# Patient Record
Sex: Female | Born: 1960 | Race: White | Hispanic: No | Smoking: Current every day smoker
Health system: Southern US, Community
[De-identification: ages and names within clinical notes are randomized; demographics above are authoritative.]

## PROBLEM LIST (undated history)

## (undated) DIAGNOSIS — I10 Essential (primary) hypertension: Secondary | ICD-10-CM

## (undated) DIAGNOSIS — I639 Cerebral infarction, unspecified: Secondary | ICD-10-CM

## (undated) HISTORY — PX: WRIST SURGERY: SHX841

## (undated) HISTORY — PX: CHOLECYSTECTOMY: SHX55

## (undated) HISTORY — PX: TUBAL LIGATION: SHX77

---

## 2011-12-27 ENCOUNTER — Encounter (HOSPITAL_COMMUNITY): Payer: Self-pay | Admitting: *Deleted

## 2011-12-27 ENCOUNTER — Emergency Department (HOSPITAL_COMMUNITY)
Admission: EM | Admit: 2011-12-27 | Discharge: 2011-12-27 | Disposition: A | Discharge status: Home / Self Care | Payer: Worker's Compensation | Attending: Emergency Medicine | Admitting: Emergency Medicine

## 2011-12-27 DIAGNOSIS — I10 Essential (primary) hypertension: Secondary | ICD-10-CM | POA: Insufficient documentation

## 2011-12-27 DIAGNOSIS — Z23 Encounter for immunization: Secondary | ICD-10-CM | POA: Insufficient documentation

## 2011-12-27 DIAGNOSIS — S61512A Laceration without foreign body of left wrist, initial encounter: Secondary | ICD-10-CM

## 2011-12-27 DIAGNOSIS — F17200 Nicotine dependence, unspecified, uncomplicated: Secondary | ICD-10-CM | POA: Insufficient documentation

## 2011-12-27 DIAGNOSIS — I2510 Atherosclerotic heart disease of native coronary artery without angina pectoris: Secondary | ICD-10-CM | POA: Insufficient documentation

## 2011-12-27 DIAGNOSIS — S61509A Unspecified open wound of unspecified wrist, initial encounter: Secondary | ICD-10-CM | POA: Insufficient documentation

## 2011-12-27 DIAGNOSIS — W260XXA Contact with knife, initial encounter: Secondary | ICD-10-CM | POA: Insufficient documentation

## 2011-12-27 DIAGNOSIS — Y990 Civilian activity done for income or pay: Secondary | ICD-10-CM | POA: Insufficient documentation

## 2011-12-27 HISTORY — DX: Essential (primary) hypertension: I10

## 2011-12-27 MED ORDER — TETANUS-DIPHTH-ACELL PERTUSSIS 5-2.5-18.5 LF-MCG/0.5 IM SUSP
0.5000 mL | Freq: Once | INTRAMUSCULAR | Status: AC
  Administered 2011-12-27: 0.5 mL via INTRAMUSCULAR
  Filled 2011-12-27: qty 0.5

## 2011-12-27 NOTE — ED Notes (Signed)
Pt was at work, was opening box with utility knife, knife cut inside left wrist,called EMS, brought to ED, pt with pressure bandage on left wrist and arm, last tetanus shop 2005, pt states little discomfort in arm

## 2011-12-27 NOTE — ED Notes (Signed)
Pt cut left anterior wrist with knife while opening boxes at work. Wrist wrapped by EMS pta. Bleeding controlled at this time. CMS intact distally to laceration.

## 2011-12-27 NOTE — ED Provider Notes (Signed)
History  This chart was scribed for Michelle Anger, DO by Michelle Montes. The patient was seen in room TR08C/TR08C. Patient's care was started at 1535     CSN: [id number]  Arrival date & time 12/27/11  1304   First MD Initiated Contact with Patient 12/27/11 1535      Chief Complaint  Patient presents with  . Extremity Laceration    The history is provided by the patient. No language interpreter was used.    Michelle Montes is a 51 y.o. female who presents to the Emergency Department complaining of sudden onset and persistence of constant laceration to her left volar forearm that began approx noon today PTA.  Patient says that she was using a box cutter at work when she accidentally cut herself. EMS applied wrapping to the wound and transported the patient to the ED. Patient is not reporting any other symptoms at this time. Patient says Tdap is not UTD. Denies any other injuries, no focal motor weakness, no tingling/numbness in extremity.   Past Medical History  Diagnosis Date  . Hypertension   . Coronary artery disease     History reviewed. No pertinent past surgical history.   History  Substance Use Topics  . Smoking status: Current Every Day Smoker  . Smokeless tobacco: Not on file  . Alcohol Use: No     Review of Systems ROS: Statement: All systems negative except as marked or noted in the HPI; Constitutional: Negative for fever and chills. ; ; Eyes: Negative for eye pain, redness and discharge. ; ; ENMT: Negative for ear pain, hoarseness, nasal congestion, sinus pressure and sore throat. ; ; Cardiovascular: Negative for chest pain, palpitations, diaphoresis, dyspnea and peripheral edema. ; ; Respiratory: Negative for cough, wheezing and stridor. ; ; Gastrointestinal: Negative for nausea, vomiting, diarrhea, abdominal pain, blood in stool, hematemesis, jaundice and rectal bleeding. . ; ; Genitourinary: Negative for dysuria, flank pain and hematuria. ; ; Musculoskeletal:  Negative for back pain and neck pain. Negative for swelling and trauma.; ; Skin: +laceration. Negative for pruritus, rash, abrasions, blisters, bruising and skin lesion.; ; Neuro: Negative for headache, lightheadedness and neck stiffness. Negative for weakness, altered level of consciousness , altered mental status, extremity weakness, paresthesias, involuntary movement, seizure and syncope.       Allergies  Review of patient's allergies indicates no known allergies.  Home Medications  No current outpatient prescriptions on file.  BP 142/83  Pulse 76  Temp 98.2 F (36.8 C)  Resp 20  SpO2 98%  Physical Exam 1545: Physical examination:  Nursing notes reviewed; Vital signs and O2 SAT reviewed;  Constitutional: Well developed, Well nourished, Well hydrated, In no acute distress; Head:  Normocephalic, atraumatic; Eyes: EOMI, PERRL, No scleral icterus; ENMT: Mouth and pharynx normal, Mucous membranes moist; Neck: Supple, Full range of motion, No lymphadenopathy; Cardiovascular: Regular rate and rhythm, No murmur, rub, or gallop; Respiratory: Breath sounds clear & equal bilaterally, No rales, rhonchi, wheezes.  Speaking full sentences with ease, Normal respiratory effort/excursion; Chest: Nontender, Movement normal;; Extremities: Pulses normal, No tenderness, No edema, No deformity. NMS intact left hand.; Neuro: AA&Ox3, Major CN grossly intact.  Speech clear. No gross focal motor or sensory deficits in extremities.; Skin: Color normal, Warm, Dry; +approx 1-2cm hemostatic linear lac to left lateral volar forearm.    ED Course  Procedures    Montes, Michelle B laceration of the left forearm LACERATION REPAIR Performed by: Michelle Montes B  Performed by Michelle Hector, PA-S, under my  supervision Authorized by: Michelle Montes B Consent: Verbal consent obtained. Risks and benefits: risks, benefits and alternatives were discussed Consent given by: patient Patient identity confirmed: provided demographic  data Prepped and Draped in normal sterile fashion Wound explored  Laceration Location: left forearm  Laceration Length: 1.5cm  No Foreign Bodies seen or palpated  Anesthesia: local infiltration  Local anesthetic: lidocaine 2% no epinephrine  Anesthetic total: 2 ml  Irrigation method: syringe Amount of cleaning: standard  Skin closure: 4-0 prolene  Number of sutures: 3  Technique: simple interrupted  Patient tolerance: Patient tolerated the procedure well with no immediate complications.     MDM  MDM Reviewed: nursing note and vitals     1700:  Laceration closed by midlevel.  DSD applied.  Td updated.  Dx d/w pt and family.  Questions answered.  Verb understanding, agreeable to d/c home with outpt f/u.       Laceration repair only was performed by the non-physician practitioner.  I was immediately available for consultation/collaboration.    I personally performed the services described in this documentation (with the exception of laceration repair), which was scribed in my presence. The recorded information has been reviewed and considered. Michelle Montes Michelle Quarry, DO 12/29/11 1341

## 2013-12-18 DIAGNOSIS — G5622 Lesion of ulnar nerve, left upper limb: Secondary | ICD-10-CM

## 2013-12-18 DIAGNOSIS — R0683 Snoring: Secondary | ICD-10-CM | POA: Insufficient documentation

## 2013-12-18 HISTORY — DX: Lesion of ulnar nerve, left upper limb: G56.22

## 2015-04-13 DIAGNOSIS — I639 Cerebral infarction, unspecified: Secondary | ICD-10-CM

## 2015-04-13 HISTORY — DX: Cerebral infarction, unspecified: I63.9

## 2017-09-19 DIAGNOSIS — I160 Hypertensive urgency: Secondary | ICD-10-CM

## 2017-09-19 DIAGNOSIS — R270 Ataxia, unspecified: Secondary | ICD-10-CM

## 2017-09-19 DIAGNOSIS — I639 Cerebral infarction, unspecified: Secondary | ICD-10-CM

## 2017-09-19 DIAGNOSIS — I6789 Other cerebrovascular disease: Secondary | ICD-10-CM

## 2017-09-20 DIAGNOSIS — R270 Ataxia, unspecified: Secondary | ICD-10-CM | POA: Diagnosis not present

## 2017-09-20 DIAGNOSIS — I639 Cerebral infarction, unspecified: Secondary | ICD-10-CM | POA: Diagnosis not present

## 2017-09-20 DIAGNOSIS — I160 Hypertensive urgency: Secondary | ICD-10-CM | POA: Diagnosis not present

## 2017-09-21 DIAGNOSIS — R270 Ataxia, unspecified: Secondary | ICD-10-CM | POA: Diagnosis not present

## 2017-09-21 DIAGNOSIS — I639 Cerebral infarction, unspecified: Secondary | ICD-10-CM | POA: Diagnosis not present

## 2017-09-21 DIAGNOSIS — I160 Hypertensive urgency: Secondary | ICD-10-CM | POA: Diagnosis not present

## 2017-10-12 ENCOUNTER — Other Ambulatory Visit: Payer: Self-pay | Admitting: Neurosurgery

## 2017-10-12 DIAGNOSIS — I671 Cerebral aneurysm, nonruptured: Secondary | ICD-10-CM

## 2017-10-14 ENCOUNTER — Ambulatory Visit (HOSPITAL_COMMUNITY)
Admission: RE | Admit: 2017-10-14 | Discharge: 2017-10-14 | Disposition: A | Discharge status: Home / Self Care | Payer: BC Managed Care – PPO | Source: Ambulatory Visit | Attending: Neurosurgery | Admitting: Neurosurgery

## 2017-10-14 ENCOUNTER — Encounter (HOSPITAL_COMMUNITY): Payer: Self-pay | Admitting: *Deleted

## 2017-10-14 ENCOUNTER — Other Ambulatory Visit: Payer: Self-pay | Admitting: Neurosurgery

## 2017-10-14 DIAGNOSIS — I671 Cerebral aneurysm, nonruptured: Secondary | ICD-10-CM | POA: Diagnosis not present

## 2017-10-14 DIAGNOSIS — I10 Essential (primary) hypertension: Secondary | ICD-10-CM | POA: Diagnosis not present

## 2017-10-14 DIAGNOSIS — F17200 Nicotine dependence, unspecified, uncomplicated: Secondary | ICD-10-CM | POA: Insufficient documentation

## 2017-10-14 DIAGNOSIS — I2510 Atherosclerotic heart disease of native coronary artery without angina pectoris: Secondary | ICD-10-CM | POA: Insufficient documentation

## 2017-10-14 HISTORY — PX: IR ANGIO INTRA EXTRACRAN SEL INTERNAL CAROTID BILAT MOD SED: IMG5363

## 2017-10-14 HISTORY — PX: IR ANGIO VERTEBRAL SEL VERTEBRAL BILAT MOD SED: IMG5369

## 2017-10-14 LAB — CBC WITH DIFFERENTIAL/PLATELET
ABS IMMATURE GRANULOCYTES: 0 10*3/uL (ref 0.0–0.1)
BASOS ABS: 0 10*3/uL (ref 0.0–0.1)
Basophils Relative: 1 %
EOS ABS: 0.2 10*3/uL (ref 0.0–0.7)
Eosinophils Relative: 2 %
HCT: 45.7 % (ref 36.0–46.0)
Hemoglobin: 14.4 g/dL (ref 12.0–15.0)
Immature Granulocytes: 0 %
Lymphocytes Relative: 28 %
Lymphs Abs: 2.3 10*3/uL (ref 0.7–4.0)
MCH: 27.5 pg (ref 26.0–34.0)
MCHC: 31.5 g/dL (ref 30.0–36.0)
MCV: 87.4 fL (ref 78.0–100.0)
MONO ABS: 0.5 10*3/uL (ref 0.1–1.0)
MONOS PCT: 6 %
NEUTROS ABS: 5.2 10*3/uL (ref 1.7–7.7)
NEUTROS PCT: 63 %
Platelets: 184 10*3/uL (ref 150–400)
RBC: 5.23 MIL/uL — ABNORMAL HIGH (ref 3.87–5.11)
RDW: 13.7 % (ref 11.5–15.5)
WBC: 8.2 10*3/uL (ref 4.0–10.5)

## 2017-10-14 LAB — BASIC METABOLIC PANEL
ANION GAP: 8 (ref 5–15)
BUN: 8 mg/dL (ref 6–20)
CO2: 29 mmol/L (ref 22–32)
CREATININE: 0.54 mg/dL (ref 0.44–1.00)
Calcium: 9.6 mg/dL (ref 8.9–10.3)
Chloride: 105 mmol/L (ref 98–111)
Glucose, Bld: 89 mg/dL (ref 70–99)
Potassium: 4.4 mmol/L (ref 3.5–5.1)
Sodium: 142 mmol/L (ref 135–145)

## 2017-10-14 LAB — PROTIME-INR
INR: 0.96
PROTHROMBIN TIME: 12.7 s (ref 11.4–15.2)

## 2017-10-14 LAB — APTT: APTT: 30 s (ref 24–36)

## 2017-10-14 MED ORDER — LIDOCAINE HCL 1 % IJ SOLN
INTRAMUSCULAR | Status: AC
  Filled 2017-10-14: qty 20

## 2017-10-14 MED ORDER — MIDAZOLAM HCL 2 MG/2ML IJ SOLN
INTRAMUSCULAR | Status: AC | PRN
  Administered 2017-10-14: 1 mg via INTRAVENOUS

## 2017-10-14 MED ORDER — CHLORHEXIDINE GLUCONATE CLOTH 2 % EX PADS: 6.0000 | MEDICATED_PAD | Freq: Once | CUTANEOUS | Status: DC

## 2017-10-14 MED ORDER — IOPAMIDOL (ISOVUE-300) INJECTION 61%
INTRAVENOUS | Status: AC
  Administered 2017-10-14: 45 mL
  Filled 2017-10-14: qty 100

## 2017-10-14 MED ORDER — IOPAMIDOL (ISOVUE-300) INJECTION 61%
INTRAVENOUS | Status: AC
  Filled 2017-10-14: qty 100

## 2017-10-14 MED ORDER — CEFAZOLIN SODIUM-DEXTROSE 2-4 GM/100ML-% IV SOLN: 2.0000 g | INTRAVENOUS | Status: DC

## 2017-10-14 MED ORDER — SODIUM CHLORIDE 0.9 % IV SOLN
INTRAVENOUS | Status: DC
  Administered 2017-10-14: 14:00:00 via INTRAVENOUS

## 2017-10-14 MED ORDER — FENTANYL CITRATE (PF) 100 MCG/2ML IJ SOLN
INTRAMUSCULAR | Status: AC
  Filled 2017-10-14: qty 2

## 2017-10-14 MED ORDER — LIDOCAINE HCL (PF) 1 % IJ SOLN
INTRAMUSCULAR | Status: AC | PRN
  Administered 2017-10-14: 10 mL

## 2017-10-14 MED ORDER — MIDAZOLAM HCL 2 MG/2ML IJ SOLN
INTRAMUSCULAR | Status: AC
  Filled 2017-10-14: qty 2

## 2017-10-14 MED ORDER — HYDROCODONE-ACETAMINOPHEN 5-325 MG PO TABS: 1.0000 | ORAL_TABLET | ORAL | Status: DC | PRN

## 2017-10-14 MED ORDER — HEPARIN SODIUM (PORCINE) 1000 UNIT/ML IJ SOLN
INTRAMUSCULAR | Status: AC
  Filled 2017-10-14: qty 2

## 2017-10-14 MED ORDER — FENTANYL CITRATE (PF) 100 MCG/2ML IJ SOLN
INTRAMUSCULAR | Status: AC | PRN
  Administered 2017-10-14: 25 ug via INTRAVENOUS

## 2017-10-14 NOTE — Sedation Documentation (Signed)
Sheath removed, 5FR exoseal closure device used. Manual pressure being held at right groin site.

## 2017-10-14 NOTE — H&P (Signed)
Chief Complaint     Aneurysm  HPI   HPI: Michelle Montes is a 57 y.o. female  who was recently admitted after suffering a left hemispheric stroke approximately 1 month ago.  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  She has been doing well since being discharged home.  She denies any issues with gait instability, weakness in her extremities, headaches, neck pain or incoordination.   She does have a history of prior stroke back in 2014.  No history of MI.  She has a history of hypertension but has been treating with diet changes since 2017  But due to her recent stroke, she was restarted on antihypertensive in the hospital.  She also takes 81 mg aspirin.   She presents today for diagnostic cerebral angiogram for further characterization of aneurysm.   She is without any concerns today.  There are no active problems to display for this patient.   PMH: Past Medical History:  Diagnosis Date  . Coronary artery disease   . Hypertension     PSH: No past surgical history on file.   (Not in a hospital admission)  SH: Social History   Tobacco Use  . Smoking status: Current Every Day Smoker  Substance Use Topics  . Alcohol use: No  . Drug use: Not on file    MEDS: Prior to Admission medications   Not on File    ALLERGY: No Known Allergies  Social History   Tobacco Use  . Smoking status: Current Every Day Smoker  Substance Use Topics  . Alcohol use: No     No family history on file.   ROS   ROS  Exam   There were no vitals filed for this visit. General appearance: WDWN, NAD Eyes: PERRL, Fundoscopic: normal Cardiovascular: Regular rate and rhythm without murmurs, rubs, gallops. No edema or variciosities. Distal pulses normal. Pulmonary: Clear to auscultation Musculoskeletal:     Muscle tone upper extremities: Normal    Muscle tone lower extremities: Normal    Motor exam: Upper  Extremities Deltoid Bicep Tricep Grip  Right 5/5 5/5 5/5 5/5  Left 5/5 5/5 5/5 5/5   Lower Extremity IP Quad PF DF EHL  Right 5/5 5/5 5/5 5/5 5/5  Left 5/5 5/5 5/5 5/5 5/5   Neurological Awake, alert, oriented Memory and concentration grossly intact Speech fluent, appropriate CNII: Visual fields normal CNIII/IV/VI: EOMI CNV: Facial sensation normal CNVII: Symmetric, normal strength CNVIII: Grossly normal CNIX: Normal palate movement CNXI: Trap and SCM strength normal CN XII: Tongue protrusion normal Sensation grossly intact to LT DTR: Normal Coordination (finger/nose & heel/shin): Normal  Results - Imaging/Labs   No results found for this or any previous visit (from the past 48 hour(s)).  No results found.   Impression/Plan   57 y.o. female about 1 month out from multiple small left hemispheric likely embolic strokes.  Workup demonstrated the presence of an approximately 12 mm left cavernous aneurysm. There is concern that the source of emboli might be from the aneurysm itself.   Based on size and age, it was recommended that she undergo   Diagnostic cerebral angiogram.   While in the office, The general treatment options were also discussed, with the need for definitive diagnosis by catheter angiogram. The risks of the angiogram procedure were reviewed, including a 0.1% risk of stroke, and overal risk of approximately 1% including but not limited to groin hematoma, headache,  contrast reaction, and nephropathy.  The patient and her husband understood our discussion and are willing to proceed as above.  All their questions were answered. Consent has been signed.

## 2017-10-14 NOTE — Discharge Instructions (Addendum)
Cerebral Angiogram, Care After °Refer to this sheet in the next few weeks. These instructions provide you with information on caring for yourself after your procedure. Your health care provider may also give you more specific instructions. Your treatment has been planned according to current medical practices, but problems sometimes occur. Call your health care provider if you have any problems or questions after your procedure. °What can I expect after the procedure? °After your procedure, it is typical to have the following: °· Bruising at the catheter insertion site that usually fades within 1-2 weeks. °· Blood collecting in the tissue (hematoma) that may be painful to the touch. It should usually decrease in size and tenderness within 1-2 weeks. °· A mild headache. ° °Follow these instructions at home: °· Take medicines only as directed by your health care provider. °· You may shower 24-48 hours after the procedure or as directed by your health care provider. Remove the bandage (dressing) and gently wash the site with plain soap and water. Pat the area dry with a clean towel. Do not rub the site, because this may cause bleeding. °· Do not take baths, swim, or use a hot tub until your health care provider approves. °· Check your insertion site every day for redness, swelling, or drainage. °· Do not apply powder or lotion to the site. °· Do not lift over 10 lb (4.5 kg) for 5 days after your procedure or as directed by your health care provider. °· Ask your health care provider when it is okay to: °? Return to work or school. °? Resume usual physical activities or sports. °? Resume sexual activity. °· Do not drive home if you are discharged the same day as the procedure. Have someone else drive you. °· You may drive 24 hours after the procedure unless otherwise instructed by your health care provider. °· Do not operate machinery or power tools for 24 hours after the procedure or as directed by your health care  provider. °· If your procedure was done as an outpatient procedure, which means that you went home the same day as your procedure, a responsible adult should be with you for the first 24 hours after you arrive home. °· Keep all follow-up visits as directed by your health care provider. This is important. °Contact a health care provider if: °· You have a fever. °· You have chills. °· You have increased bleeding from the catheter insertion site. Hold pressure on the site. °Get help right away if: °· You have vision changes or loss of vision. °· You have numbness or weakness on one side of your body. °· You have difficulty talking, or you have slurred speech or cannot speak (aphasia). °· You feel confused or have difficulty remembering. °· You have unusual pain at the catheter insertion site. °· You have redness, warmth, or swelling at the catheter insertion site. °· You have drainage (other than a small amount of blood on the dressing) from the catheter insertion site. °· The catheter insertion site is bleeding, and the bleeding does not stop after 30 minutes of holding steady pressure on the site. °These symptoms may represent a serious problem that is an emergency. Do not wait to see if the symptoms will go away. Get medical help right away. Call your local emergency services (911 in [place] Do not drive yourself to the hospital. °This information is not intended to replace advice given to you by your health care provider. Make sure you discuss any questions   you have with your health care provider. °Document Released: 08/13/2013 Document Revised: 09/04/2015 Document Reviewed: 04/11/2013 °Elsevier Interactive Patient Education © 2017 [employer] Inc[employer] °Moderate Conscious Sedation, Adult, Care After °These instructions provide you with information about caring for yourself after your procedure. Your health care provider may also give you more specific instructions. Your treatment has been planned according to current  medical practices, but problems sometimes occur. Call your health care provider if you have any problems or questions after your procedure. °What can I expect after the procedure? °After your procedure, it is common: °· To feel sleepy for several hours. °· To feel clumsy and have poor balance for several hours. °· To have poor judgment for several hours. °· To vomit if you eat too soon. ° °Follow these instructions at home: °For at least 24 hours after the procedure: ° °· Do not: °? Participate in activities where you could fall or become injured. °? Drive. °? Use heavy machinery. °? Drink alcohol. °? Take sleeping pills or medicines that cause drowsiness. °? Make important decisions or sign legal documents. °? Take care of children on your own. °· Rest. °Eating and drinking °· Follow the diet recommended by your health care provider. °· If you vomit: °? Drink water, juice, or soup when you can drink without vomiting. °? Make sure you have little or no nausea before eating solid foods. °General instructions °· Have a responsible adult stay with you until you are awake and alert. °· Take over-the-counter and prescription medicines only as told by your health care provider. °· If you smoke, do not smoke without supervision. °· Keep all follow-up visits as told by your health care provider. This is important. °Contact a health care provider if: °· You keep feeling nauseous or you keep vomiting. °· You feel light-headed. °· You develop a rash. °· You have a fever. °Get help right away if: °· You have trouble breathing. °This information is not intended to replace advice given to you by your health care provider. Make sure you discuss any questions you have with your health care provider. °Document Released: 01/17/2013 Document Revised: 09/01/2015 Document Reviewed: 07/19/2015 °Elsevier Interactive Patient Education © 2018 Elsevier Inc. ° °

## 2017-10-27 ENCOUNTER — Other Ambulatory Visit: Payer: Self-pay | Admitting: Neurosurgery

## 2017-10-27 ENCOUNTER — Other Ambulatory Visit (HOSPITAL_COMMUNITY): Payer: Self-pay | Admitting: Neurosurgery

## 2017-10-27 DIAGNOSIS — I671 Cerebral aneurysm, nonruptured: Secondary | ICD-10-CM

## 2017-11-10 ENCOUNTER — Other Ambulatory Visit: Payer: Self-pay | Admitting: Neurosurgery

## 2017-11-14 ENCOUNTER — Encounter (HOSPITAL_COMMUNITY): Payer: Self-pay

## 2017-11-14 ENCOUNTER — Encounter (HOSPITAL_COMMUNITY)
Admission: RE | Admit: 2017-11-14 | Discharge: 2017-11-14 | Disposition: A | Discharge status: Home / Self Care | Payer: BC Managed Care – PPO | Source: Ambulatory Visit | Attending: Neurosurgery | Admitting: Neurosurgery

## 2017-11-14 ENCOUNTER — Other Ambulatory Visit: Payer: Self-pay

## 2017-11-14 DIAGNOSIS — Z01812 Encounter for preprocedural laboratory examination: Secondary | ICD-10-CM | POA: Diagnosis present

## 2017-11-14 HISTORY — DX: Cerebral infarction, unspecified: I63.9

## 2017-11-14 LAB — CBC WITH DIFFERENTIAL/PLATELET
ABS IMMATURE GRANULOCYTES: 0 10*3/uL (ref 0.0–0.1)
BASOS ABS: 0 10*3/uL (ref 0.0–0.1)
Basophils Relative: 1 %
Eosinophils Absolute: 0.2 10*3/uL (ref 0.0–0.7)
Eosinophils Relative: 3 %
HCT: 45.6 % (ref 36.0–46.0)
HEMOGLOBIN: 14.7 g/dL (ref 12.0–15.0)
Immature Granulocytes: 0 %
LYMPHS PCT: 27 %
Lymphs Abs: 2.1 10*3/uL (ref 0.7–4.0)
MCH: 28 pg (ref 26.0–34.0)
MCHC: 32.2 g/dL (ref 30.0–36.0)
MCV: 86.9 fL (ref 78.0–100.0)
MONO ABS: 0.5 10*3/uL (ref 0.1–1.0)
Monocytes Relative: 6 %
NEUTROS ABS: 4.8 10*3/uL (ref 1.7–7.7)
Neutrophils Relative %: 63 %
Platelets: 206 10*3/uL (ref 150–400)
RBC: 5.25 MIL/uL — AB (ref 3.87–5.11)
RDW: 13.8 % (ref 11.5–15.5)
WBC: 7.7 10*3/uL (ref 4.0–10.5)

## 2017-11-14 LAB — URINALYSIS, ROUTINE W REFLEX MICROSCOPIC
BILIRUBIN URINE: NEGATIVE
Glucose, UA: NEGATIVE mg/dL
HGB URINE DIPSTICK: NEGATIVE
Ketones, ur: NEGATIVE mg/dL
LEUKOCYTES UA: NEGATIVE
NITRITE: NEGATIVE
PROTEIN: NEGATIVE mg/dL
Specific Gravity, Urine: 1.002 — ABNORMAL LOW (ref 1.005–1.030)
pH: 7 (ref 5.0–8.0)

## 2017-11-14 LAB — BASIC METABOLIC PANEL
ANION GAP: 9 (ref 5–15)
BUN: 5 mg/dL — ABNORMAL LOW (ref 6–20)
CO2: 29 mmol/L (ref 22–32)
Calcium: 9.6 mg/dL (ref 8.9–10.3)
Chloride: 105 mmol/L (ref 98–111)
Creatinine, Ser: 0.58 mg/dL (ref 0.44–1.00)
GFR calc Af Amer: >60 mL/min (ref >60)
GLUCOSE: 95 mg/dL (ref 70–99)
POTASSIUM: 3.8 mmol/L (ref 3.5–5.1)
Sodium: 143 mmol/L (ref 135–145)

## 2017-11-14 LAB — PROTIME-INR
INR: 0.94
PROTHROMBIN TIME: 12.5 s (ref 11.4–15.2)

## 2017-11-14 LAB — APTT: APTT: 29 s (ref 24–36)

## 2017-11-14 LAB — SURGICAL PCR SCREEN
MRSA, PCR: NEGATIVE
Staphylococcus aureus: NEGATIVE

## 2017-11-14 NOTE — Pre-Procedure Instructions (Signed)
Michelle Montes  11/14/2017      [facility] [address], [place] - [address] [place] [address] Phone: [phone] Fax: [phone]  [facility] [phone] [facility], [place] - [address] AT [facility] [address] [phone] [place] [address] Phone: [phone] Fax: [phone]    Your procedure is scheduled on Tuesday, August 5.  Report to [facility] Admitting at 9:15 AM                  Your surgery or procedure is scheduled for 11:15 AM A.M.   Call this number if you have problems the morning of surgery: history of: [phone]  This is the number for the Pre- Surgical Desk.      For any other questions, please call [phone], Monday - Friday 8 AM - 4 PM.   Remember:  Do not eat or drink after midnight Monday, August 12.   Take these medicines the morning of surgery with A SIP OF WATER:  Follow your surgeon's instructions regarding Aspirin, Plavix.  Take if needed:  1 Week prior to surgery STOP takingAspirin Products [employer], Excedrin Migraine), Ibuprofen (Advil), Naproxen (Aleve), Vitamins and Herbal Products (ie Fish Oil) omeprazole (PRILOSEC OTC)     Do not wear jewelry, make-up or nail polish.  Do not wear lotions, powders, or perfumes, or deodorant.  Do not shave 48 hours prior to surgery.  Men may shave face and neck.  Do not bring valuables to the hospital.  [facility] is not responsible for any belongings or valuables.  Contacts, dentures or bridgework may not be worn into surgery.  Leave your suitcase in the car.  After surgery it may be brought to your room.  For patients admitted to the hospital, discharge time will be determined by your treatment team.  Patients discharged the day of surgery will not be allowed to drive home.    Special instructions:   [place]- Preparing For Surgery  Before surgery, you can play an important role. Because skin is not sterile, your skin needs to be as free of  germs as possible. You can reduce the number of germs on your skin by washing with CHG (chlorahexidine gluconate) Soap before surgery.  CHG is an antiseptic cleaner which kills germs and bonds with the skin to continue killing germs even after washing.    Oral Hygiene is also important to reduce your risk of infection.  Remember - BRUSH YOUR TEETH THE MORNING OF SURGERY WITH YOUR REGULAR TOOTHPASTE  Please do not use if you have an allergy to CHG or antibacterial soaps. If your skin becomes reddened/irritated stop using the CHG.  Do not shave (including legs and underarms) for at least 48 hours prior to first CHG shower. It is OK to shave your face.  Please follow these instructions carefully.   1. Shower the NIGHT BEFORE SURGERY and the MORNING OF SURGERY with CHG.   2. If you chose to wash your hair, wash your hair first as usual with your normal shampoo.  3. After you shampoo, rinse your hair and body thoroughly to remove the shampoo.  4. Use CHG as you would any other liquid soap. You can apply CHG directly to the skin and wash gently with a scrungie or a clean washcloth.   5. Apply the CHG Soap to your body ONLY FROM THE NECK DOWN.  Do not use on open wounds  or open sores. Avoid contact with your eyes, ears, mouth and genitals (private parts). Wash Face and genitals (private parts)  with your normal soap.  6. Wash thoroughly, paying special attention to the area where your surgery will be performed.  7. Thoroughly rinse your body with warm water from the neck down.  8. DO NOT shower/wash with your normal soap after using and rinsing off the CHG Soap.  9. Pat yourself dry with a CLEAN TOWEL.  10. Wear CLEAN PAJAMAS to bed the night before surgery, wear comfortable clothes the morning of surgery  11. Place CLEAN SHEETS on your bed the night of your first shower and DO NOT SLEEP WITH PETS.  Day of Surgery:  Do not apply any deodorants/lotions.  Please wear clean clothes to the  hospital/surgery center.   Remember to brush your teeth WITH YOUR REGULAR TOOTHPASTE.   Please read over the following fact sheets that you were given.

## 2017-11-14 NOTE — Pre-Procedure Instructions (Signed)
Michelle Montes  11/14/2017      [facility] [address] - [place], [place] - [address] [place] [id number] Phone: [phone] Fax: [phone]  [facility] [phone] [facility]- [place], [place] - [address] [facility] [address] [phone] [address] [place] [address] Phone: [phone] Fax: [phone]    Your procedure is scheduled on Tuesday, August 5.  [facility] at 9:45 AM                  Your surgery or procedure is scheduled for 11:15 AM A.M.   Call this number if you have problems the morning of surgery: history of: [phone]  This is the number for the Pre- Surgical Desk.      For any other questions, please call [phone], Monday - Friday 8 AM - 4 PM.   Remember:  Do not eat or drink after midnight Monday, August 12.   Take these medicines the morning of surgery with A SIP OF WATER: omeprazole (prilosec)  Follow your surgeon's instructions regarding Aspirin, Plavix.  Take if needed:  1 Week prior to surgery STOP taking Aspirin Products (Goody Powder, Excedrin Migraine), Ibuprofen (Advil), Naproxen (Aleve), Vitamins and Herbal Products (ie Fish Oil)     Do not wear jewelry, make-up or nail polish.  Do not wear lotions, powders, or perfumes, or deodorant.  Do not shave 48 hours prior to surgery.  Men may shave face and neck.  Do not bring valuables to the hospital.  [facility] is not responsible for any belongings or valuables.  Contacts, dentures or bridgework may not be worn into surgery.  Leave your suitcase in the car.  After surgery it may be brought to your room.  For patients admitted to the hospital, discharge time will be determined by your treatment team.  Patients discharged the day of surgery will not be allowed to drive home.    Special instructions:   [place]- Preparing For Surgery  Before surgery, you can play an important role. Because skin is not sterile, your skin needs to be as free of  germs as possible. You can reduce the number of germs on your skin by washing with CHG (chlorahexidine gluconate) Soap before surgery.  CHG is an antiseptic cleaner which kills germs and bonds with the skin to continue killing germs even after washing.    Oral Hygiene is also important to reduce your risk of infection.  Remember - BRUSH YOUR TEETH THE MORNING OF SURGERY WITH YOUR REGULAR TOOTHPASTE  Please do not use if you have an allergy to CHG or antibacterial soaps. If your skin becomes reddened/irritated stop using the CHG.  Do not shave (including legs and underarms) for at least 48 hours prior to first CHG shower. It is OK to shave your face.  Please follow these instructions carefully.   1. Shower the NIGHT BEFORE SURGERY and the MORNING OF SURGERY with CHG.   2. If you chose to wash your hair, wash your hair first as usual with your normal shampoo.  3. After you shampoo, rinse your hair and body thoroughly to remove the shampoo.  4. Use CHG as you would any other liquid soap. You can apply CHG directly to the skin and wash gently with a scrungie or a clean washcloth.   5. Apply the CHG Soap to your body ONLY FROM THE NECK DOWN.  Do not use on open wounds  or open sores. Avoid contact with your eyes, ears, mouth and genitals (private parts). Wash Face and genitals (private parts)  with your normal soap.  6. Wash thoroughly, paying special attention to the area where your surgery will be performed.  7. Thoroughly rinse your body with warm water from the neck down.  8. DO NOT shower/wash with your normal soap after using and rinsing off the CHG Soap.  9. Pat yourself dry with a CLEAN TOWEL.  10. Wear CLEAN PAJAMAS to bed the night before surgery, wear comfortable clothes the morning of surgery  11. Place CLEAN SHEETS on your bed the night of your first shower and DO NOT SLEEP WITH PETS.  Day of Surgery:  Do not apply any deodorants/lotions.  Please wear clean clothes to the  hospital/surgery center.   Remember to brush your teeth WITH YOUR REGULAR TOOTHPASTE.   Please read over the following fact sheets that you were given.

## 2017-11-14 NOTE — Progress Notes (Signed)
PCP - Dr. Boyd KerbsPenny Cardiologist - denies cardiac history or cardiac workup  EKG - done in June per patient- [facility]  Pt to take Plavix and Aspirin prior to surgery and up until surgery.   Anesthesia review: follow up requested EKG, pt denies any other cardiac testing  Patient denies shortness of breath, fever, cough and chest pain at PAT appointment   Patient verbalized understanding of instructions that were given to them at the PAT appointment. Patient was also instructed that they will need to review over the PAT instructions again at home before surgery.

## 2017-11-22 ENCOUNTER — Ambulatory Visit (HOSPITAL_COMMUNITY)
Admission: RE | Admit: 2017-11-22 | Discharge: 2017-11-22 | Disposition: A | Discharge status: Home / Self Care | Payer: BC Managed Care – PPO | Source: Ambulatory Visit | Attending: Neurosurgery | Admitting: Neurosurgery

## 2017-12-08 ENCOUNTER — Other Ambulatory Visit: Payer: Self-pay

## 2017-12-08 ENCOUNTER — Encounter (HOSPITAL_COMMUNITY): Payer: Self-pay | Admitting: *Deleted

## 2017-12-08 NOTE — Progress Notes (Signed)
Spoke with pt for pre-op call. Pt had a PAT appt on 11/14/17, but surgery was rescheduled to tomorrow. Pt states nothing has changed with her medications, allergies, medical and surgical history. She states she started the Aspirin and Plavix on 12/02/17 as instructed by Dr. Conchita ParisNundkumar. Pt denies any recent chest pain, fever, flu like symptoms.

## 2017-12-09 ENCOUNTER — Encounter (HOSPITAL_COMMUNITY): Payer: Self-pay | Admitting: *Deleted

## 2017-12-09 ENCOUNTER — Ambulatory Visit (HOSPITAL_COMMUNITY)
Admission: RE | Admit: 2017-12-09 | Discharge: 2017-12-09 | Disposition: A | Discharge status: Home / Self Care | Payer: BC Managed Care – PPO | Source: Ambulatory Visit | Attending: Neurosurgery | Admitting: Neurosurgery

## 2017-12-09 ENCOUNTER — Ambulatory Visit (HOSPITAL_COMMUNITY): Payer: BC Managed Care – PPO | Admitting: Physician Assistant

## 2017-12-09 ENCOUNTER — Encounter (HOSPITAL_COMMUNITY)
Admission: RE | Disposition: A | Discharge status: Home / Self Care | Payer: Self-pay | Source: Home / Self Care | Attending: Neurosurgery

## 2017-12-09 ENCOUNTER — Ambulatory Visit (HOSPITAL_COMMUNITY): Payer: BC Managed Care – PPO | Admitting: Anesthesiology

## 2017-12-09 ENCOUNTER — Inpatient Hospital Stay (HOSPITAL_COMMUNITY)
Admission: RE | Admit: 2017-12-09 | Discharge: 2017-12-10 | DRG: 272 | Disposition: A | Discharge status: Home / Self Care | Payer: BC Managed Care – PPO | Attending: Neurosurgery | Admitting: Neurosurgery

## 2017-12-09 DIAGNOSIS — Z8673 Personal history of transient ischemic attack (TIA), and cerebral infarction without residual deficits: Secondary | ICD-10-CM

## 2017-12-09 DIAGNOSIS — Z7902 Long term (current) use of antithrombotics/antiplatelets: Secondary | ICD-10-CM

## 2017-12-09 DIAGNOSIS — Z9851 Tubal ligation status: Secondary | ICD-10-CM

## 2017-12-09 DIAGNOSIS — I10 Essential (primary) hypertension: Secondary | ICD-10-CM | POA: Diagnosis present

## 2017-12-09 DIAGNOSIS — Z7982 Long term (current) use of aspirin: Secondary | ICD-10-CM | POA: Diagnosis not present

## 2017-12-09 DIAGNOSIS — I671 Cerebral aneurysm, nonruptured: Secondary | ICD-10-CM

## 2017-12-09 DIAGNOSIS — I720 Aneurysm of carotid artery: Secondary | ICD-10-CM | POA: Diagnosis present

## 2017-12-09 DIAGNOSIS — Z79899 Other long term (current) drug therapy: Secondary | ICD-10-CM | POA: Diagnosis not present

## 2017-12-09 DIAGNOSIS — F17210 Nicotine dependence, cigarettes, uncomplicated: Secondary | ICD-10-CM | POA: Diagnosis present

## 2017-12-09 DIAGNOSIS — Z9049 Acquired absence of other specified parts of digestive tract: Secondary | ICD-10-CM

## 2017-12-09 HISTORY — PX: IR ANGIO INTRA EXTRACRAN SEL INTERNAL CAROTID UNI L MOD SED: IMG5361

## 2017-12-09 HISTORY — PX: RADIOLOGY WITH ANESTHESIA: SHX6223

## 2017-12-09 HISTORY — PX: IR TRANSCATH/EMBOLIZ: IMG695

## 2017-12-09 HISTORY — PX: IR ANGIOGRAM FOLLOW UP STUDY: IMG697

## 2017-12-09 LAB — BASIC METABOLIC PANEL
Anion gap: 10 (ref 5–15)
BUN: 10 mg/dL (ref 6–20)
CALCIUM: 9.2 mg/dL (ref 8.9–10.3)
CO2: 22 mmol/L (ref 22–32)
CREATININE: 0.44 mg/dL (ref 0.44–1.00)
Chloride: 108 mmol/L (ref 98–111)
GLUCOSE: 89 mg/dL (ref 70–99)
Potassium: 5.2 mmol/L — ABNORMAL HIGH (ref 3.5–5.1)
Sodium: 140 mmol/L (ref 135–145)

## 2017-12-09 LAB — CBC WITH DIFFERENTIAL/PLATELET
Abs Immature Granulocytes: 0 10*3/uL (ref 0.0–0.1)
BASOS PCT: 1 %
Basophils Absolute: 0 10*3/uL (ref 0.0–0.1)
EOS ABS: 0.4 10*3/uL (ref 0.0–0.7)
EOS PCT: 7 %
HEMATOCRIT: 45.4 % (ref 36.0–46.0)
Hemoglobin: 14.2 g/dL (ref 12.0–15.0)
IMMATURE GRANULOCYTES: 0 %
LYMPHS ABS: 2.1 10*3/uL (ref 0.7–4.0)
Lymphocytes Relative: 33 %
MCH: 28 pg (ref 26.0–34.0)
MCHC: 31.3 g/dL (ref 30.0–36.0)
MCV: 89.5 fL (ref 78.0–100.0)
Monocytes Absolute: 0.4 10*3/uL (ref 0.1–1.0)
Monocytes Relative: 7 %
NEUTROS PCT: 52 %
Neutro Abs: 3.4 10*3/uL (ref 1.7–7.7)
PLATELETS: 198 10*3/uL (ref 150–400)
RBC: 5.07 MIL/uL (ref 3.87–5.11)
RDW: 14.1 % (ref 11.5–15.5)
WBC: 6.3 10*3/uL (ref 4.0–10.5)

## 2017-12-09 LAB — APTT: aPTT: 27 seconds (ref 24–36)

## 2017-12-09 LAB — PROTIME-INR
INR: 0.95
Prothrombin Time: 12.6 seconds (ref 11.4–15.2)

## 2017-12-09 SURGERY — IR WITH ANESTHESIA
Anesthesia: General

## 2017-12-09 MED ORDER — LISINOPRIL 20 MG PO TABS
20.0000 mg | ORAL_TABLET | Freq: Every day | ORAL | Status: DC
  Administered 2017-12-09 – 2017-12-10 (×2): 20 mg via ORAL
  Filled 2017-12-09 (×2): qty 1

## 2017-12-09 MED ORDER — HYDROMORPHONE HCL 1 MG/ML IJ SOLN: 0.2500 mg | INTRAMUSCULAR | Status: DC | PRN

## 2017-12-09 MED ORDER — SODIUM CHLORIDE 0.9 % IV SOLN
INTRAVENOUS | Status: DC | PRN
  Administered 2017-12-09: 20 ug/min via INTRAVENOUS

## 2017-12-09 MED ORDER — LACTATED RINGERS IV SOLN
INTRAVENOUS | Status: DC | PRN
  Administered 2017-12-09: 14:00:00 via INTRAVENOUS

## 2017-12-09 MED ORDER — ONDANSETRON HCL 4 MG/2ML IJ SOLN: 4.0000 mg | INTRAMUSCULAR | Status: DC | PRN

## 2017-12-09 MED ORDER — HYDROCODONE-ACETAMINOPHEN 5-325 MG PO TABS: 1.0000 | ORAL_TABLET | ORAL | Status: DC | PRN

## 2017-12-09 MED ORDER — HYDRALAZINE HCL 20 MG/ML IJ SOLN
INTRAMUSCULAR | Status: AC
  Filled 2017-12-09: qty 1

## 2017-12-09 MED ORDER — ONDANSETRON HCL 4 MG/2ML IJ SOLN
INTRAMUSCULAR | Status: DC | PRN
  Administered 2017-12-09: 4 mg via INTRAVENOUS

## 2017-12-09 MED ORDER — CEFAZOLIN SODIUM-DEXTROSE 2-4 GM/100ML-% IV SOLN
2.0000 g | INTRAVENOUS | Status: AC
  Administered 2017-12-09: 2 g via INTRAVENOUS
  Filled 2017-12-09: qty 100

## 2017-12-09 MED ORDER — LABETALOL HCL 5 MG/ML IV SOLN
10.0000 mg | INTRAVENOUS | Status: DC | PRN
  Administered 2017-12-09 (×4): 10 mg via INTRAVENOUS
  Administered 2017-12-09: 20 mg via INTRAVENOUS
  Filled 2017-12-09: qty 8

## 2017-12-09 MED ORDER — LABETALOL HCL 5 MG/ML IV SOLN
INTRAVENOUS | Status: AC
  Filled 2017-12-09: qty 4

## 2017-12-09 MED ORDER — ATORVASTATIN CALCIUM 80 MG PO TABS
80.0000 mg | ORAL_TABLET | Freq: Every day | ORAL | Status: DC
  Administered 2017-12-09: 80 mg via ORAL
  Filled 2017-12-09: qty 1

## 2017-12-09 MED ORDER — MORPHINE SULFATE (PF) 2 MG/ML IV SOLN: 1.0000 mg | INTRAVENOUS | Status: DC | PRN

## 2017-12-09 MED ORDER — PROMETHAZINE HCL 25 MG/ML IJ SOLN: 6.2500 mg | INTRAMUSCULAR | Status: DC | PRN

## 2017-12-09 MED ORDER — DEXAMETHASONE SODIUM PHOSPHATE 10 MG/ML IJ SOLN
INTRAMUSCULAR | Status: DC | PRN
  Administered 2017-12-09: 10 mg via INTRAVENOUS

## 2017-12-09 MED ORDER — SUGAMMADEX SODIUM 200 MG/2ML IV SOLN
INTRAVENOUS | Status: DC | PRN
  Administered 2017-12-09: 108.8 mg via INTRAVENOUS

## 2017-12-09 MED ORDER — FENTANYL CITRATE (PF) 250 MCG/5ML IJ SOLN
INTRAMUSCULAR | Status: DC | PRN
  Administered 2017-12-09 (×2): 50 ug via INTRAVENOUS

## 2017-12-09 MED ORDER — LIDOCAINE 2% (20 MG/ML) 5 ML SYRINGE
INTRAMUSCULAR | Status: DC | PRN
  Administered 2017-12-09: 60 mg via INTRAVENOUS

## 2017-12-09 MED ORDER — CLOPIDOGREL BISULFATE 75 MG PO TABS
75.0000 mg | ORAL_TABLET | Freq: Every day | ORAL | Status: DC
  Administered 2017-12-10: 75 mg via ORAL
  Filled 2017-12-09: qty 1

## 2017-12-09 MED ORDER — LACTATED RINGERS IV SOLN
Freq: Once | INTRAVENOUS | Status: AC
  Administered 2017-12-09: 11:00:00 via INTRAVENOUS

## 2017-12-09 MED ORDER — PHENYLEPHRINE 40 MCG/ML (10ML) SYRINGE FOR IV PUSH (FOR BLOOD PRESSURE SUPPORT)
PREFILLED_SYRINGE | INTRAVENOUS | Status: DC | PRN
  Administered 2017-12-09 (×2): 40 ug via INTRAVENOUS

## 2017-12-09 MED ORDER — ONDANSETRON HCL 4 MG PO TABS: 4.0000 mg | ORAL_TABLET | ORAL | Status: DC | PRN

## 2017-12-09 MED ORDER — IOHEXOL 300 MG/ML  SOLN
150.0000 mL | Freq: Once | INTRAMUSCULAR | Status: AC | PRN
  Administered 2017-12-09: 40 mL via INTRA_ARTERIAL

## 2017-12-09 MED ORDER — OXYCODONE HCL 5 MG PO TABS: 5.0000 mg | ORAL_TABLET | Freq: Once | ORAL | Status: DC | PRN

## 2017-12-09 MED ORDER — GLYCOPYRROLATE PF 0.2 MG/ML IJ SOSY
PREFILLED_SYRINGE | INTRAMUSCULAR | Status: DC | PRN
  Administered 2017-12-09: .2 mg via INTRAVENOUS

## 2017-12-09 MED ORDER — SODIUM CHLORIDE 0.9 % IV SOLN
INTRAVENOUS | Status: DC
  Administered 2017-12-09: 19:00:00 via INTRAVENOUS

## 2017-12-09 MED ORDER — CHLORHEXIDINE GLUCONATE CLOTH 2 % EX PADS: 6.0000 | MEDICATED_PAD | Freq: Once | CUTANEOUS | Status: DC

## 2017-12-09 MED ORDER — OMEPRAZOLE MAGNESIUM 20 MG PO TBEC: 20.0000 mg | DELAYED_RELEASE_TABLET | Freq: Every day | ORAL | Status: DC | PRN

## 2017-12-09 MED ORDER — ROCURONIUM BROMIDE 10 MG/ML (PF) SYRINGE
PREFILLED_SYRINGE | INTRAVENOUS | Status: DC | PRN
  Administered 2017-12-09: 50 mg via INTRAVENOUS

## 2017-12-09 MED ORDER — PANTOPRAZOLE SODIUM 20 MG PO TBEC
20.0000 mg | DELAYED_RELEASE_TABLET | Freq: Every day | ORAL | Status: DC
  Administered 2017-12-09 – 2017-12-10 (×2): 20 mg via ORAL
  Filled 2017-12-09 (×2): qty 1

## 2017-12-09 MED ORDER — ASPIRIN EC 325 MG PO TBEC
325.0000 mg | DELAYED_RELEASE_TABLET | Freq: Every day | ORAL | Status: DC
  Administered 2017-12-10: 325 mg via ORAL
  Filled 2017-12-09: qty 1

## 2017-12-09 MED ORDER — PROPOFOL 10 MG/ML IV BOLUS
INTRAVENOUS | Status: DC | PRN
  Administered 2017-12-09: 150 mg via INTRAVENOUS
  Administered 2017-12-09: 50 mg via INTRAVENOUS

## 2017-12-09 MED ORDER — HEPARIN SODIUM (PORCINE) 1000 UNIT/ML IJ SOLN
INTRAMUSCULAR | Status: DC | PRN
  Administered 2017-12-09: 5000 [IU] via INTRAVENOUS

## 2017-12-09 MED ORDER — OXYCODONE HCL 5 MG/5ML PO SOLN: 5.0000 mg | Freq: Once | ORAL | Status: DC | PRN

## 2017-12-09 MED ORDER — MEPERIDINE HCL 50 MG/ML IJ SOLN: 6.2500 mg | INTRAMUSCULAR | Status: DC | PRN

## 2017-12-09 MED ORDER — HYDRALAZINE HCL 20 MG/ML IJ SOLN
10.0000 mg | Freq: Once | INTRAMUSCULAR | Status: AC
  Administered 2017-12-09: 10 mg via INTRAVENOUS

## 2017-12-09 NOTE — Anesthesia Procedure Notes (Signed)
Arterial Line Insertion Start/End8/30/2019 12:40 PM, 12/09/2017 12:48 PM Performed by: Tillman AbideHawkins, Yulianna Folse B, CRNA, CRNA  Patient location: Pre-op. Preanesthetic checklist: patient identified, IV checked, site marked, risks and benefits discussed, surgical consent, monitors and equipment checked, pre-op evaluation, timeout performed and anesthesia consent Lidocaine 1% used for infiltration radial was placed Catheter size: 20 G Hand hygiene performed , maximum sterile barriers used  and Seldinger technique used Allen's test indicative of satisfactory collateral circulation Attempts: 1 Procedure performed without using ultrasound guided technique. Following insertion, dressing applied and Biopatch. Post procedure assessment: normal  Patient tolerated the procedure well with no immediate complications.

## 2017-12-09 NOTE — Sedation Documentation (Signed)
Sheath removed, exoseal closure device. Manual pressure being held at right groin site.

## 2017-12-09 NOTE — H&P (Signed)
Chief Complaint   Aneurysm   HPI   HPI: Michelle Montes is a 57 y.o. female with suffered a left hemispheric embolic stroke roughly 2 months ago. CTA angiogram was ordered as part of the stroke work up and was significant for a 12mm left cavernous carotid aneurysm. She was admitted for roughly 2 days at [facility] for monitoring after the CVA but was discharged without complication. She did not suffer any residual effects from CVA. She underwent diagnostic angiogram for further characterization which confirmed the aneurysm. She presents today for treatment. She is without any concerns.   There are no active problems to display for this patient.  PMH: Past Medical History:  Diagnosis Date  . Hypertension   . Stroke [facility])    had weakness on left side- no residual now    PSH: Past Surgical History:  Procedure Laterality Date  . CHOLECYSTECTOMY     2004  . IR ANGIO INTRA EXTRACRAN SEL INTERNAL CAROTID BILAT MOD SED  10/14/2017  . IR ANGIO VERTEBRAL SEL VERTEBRAL BILAT MOD SED  10/14/2017  . TUBAL LIGATION     2005  . WRIST SURGERY Left    2017- screws and plates    Medications Prior to Admission  Medication Sig Dispense Refill Last Dose  . aspirin 325 MG EC tablet Take 325 mg by mouth daily.  2   . atorvastatin (LIPITOR) 80 MG tablet Take 80 mg by mouth at bedtime.  1 10/13/2017 at Unknown time  . lisinopril (PRINIVIL,ZESTRIL) 20 MG tablet Take 20 mg by mouth daily.   1 10/14/2017 at 0630  . omeprazole (PRILOSEC OTC) 20 MG tablet Take 20 mg by mouth daily as needed (heartburn/indigestion).    Past Week at Unknown time  . clopidogrel (PLAVIX) 75 MG tablet Take 75 mg by mouth daily.  2     SH: Social History   Tobacco Use  . Smoking status: Current Every Day Smoker    Packs/day: 1.50    Years: 42.00    Pack years: 63.00    Types: Cigarettes  . Smokeless tobacco: Never Used  Substance Use Topics  . Alcohol use: No  . Drug use: Never    MEDS: Prior to Admission  medications   Medication Sig Start Date End Date Taking? Authorizing Provider  aspirin 325 MG EC tablet Take 325 mg by mouth daily. 10/27/17  Yes [provider]  atorvastatin (LIPITOR) 80 MG tablet Take 80 mg by mouth at bedtime. 09/21/17  Yes [provider]  lisinopril (PRINIVIL,ZESTRIL) 20 MG tablet Take 20 mg by mouth daily.  09/21/17  Yes [provider]  omeprazole (PRILOSEC OTC) 20 MG tablet Take 20 mg by mouth daily as needed (heartburn/indigestion).    Yes [provider]  clopidogrel (PLAVIX) 75 MG tablet Take 75 mg by mouth daily. 10/27/17   [provider]    ALLERGY: No Known Allergies  Social History   Tobacco Use  . Smoking status: Current Every Day Smoker    Packs/day: 1.50    Years: 42.00    Pack years: 63.00    Types: Cigarettes  . Smokeless tobacco: Never Used  Substance Use Topics  . Alcohol use: No     History reviewed. No pertinent family history.   ROS   ROS  Exam   There were no vitals filed for this visit. General appearance: WDWN, NAD Eyes: PERRL, Fundoscopic: normal Cardiovascular: Regular rate and rhythm without murmurs, rubs, gallops. No edema or variciosities. Distal  pulses normal. Pulmonary: Clear to auscultation Musculoskeletal:     Muscle tone upper extremities: Normal    Muscle tone lower extremities: Normal    Motor exam: Upper Extremities Deltoid Bicep Tricep Grip  Right 5/5 5/5 5/5 5/5  Left 5/5 5/5 5/5 5/5   Lower Extremity IP Quad PF DF EHL  Right 5/5 5/5 5/5 5/5 5/5  Left 5/5 5/5 5/5 5/5 5/5   Neurological Awake, alert, oriented Memory and concentration grossly intact Speech fluent, appropriate CNII: Visual fields normal CNIII/IV/VI: EOMI CNV: Facial sensation normal CNVII: Symmetric, normal strength CNVIII: Grossly normal CNIX: Normal palate movement CNXI: Trap and SCM strength normal CN XII: Tongue protrusion normal Sensation grossly intact to LT DTR: Normal Coordination  (finger/nose & heel/shin): Normal  Results - Imaging/Labs   No results found for this or any previous visit (from the past 48 hour(s)).  No results found.  Diagnostic cerebral angiogram date 10/14/17 was reviewed. 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   Impression/Plan   57 y.o. female with multiple small embolic left hemispheric strokes without carotid disease and negative cardiac work up after hospitalization. She has a 12mm left cavernous aneurysm which could the source of the emboli. Based on size and history, it was rec that she undergo treatment with a flow diverting stent.  While in the office risks, benefits and alternatives to the procedure were discussed. Patient stated in own language understanding of the procedure and wished to proceed. Consent signed.

## 2017-12-09 NOTE — Progress Notes (Signed)
  NEUROSURGERY PROGRESS NOTE   Pt seen in PACU postop. No c/o.  EXAM:  BP (!) 165/88   Pulse 66   Temp 98.7 F (37.1 C) (Oral)   Resp 18   Ht 5\' 6"  (1.676 m)   Wt 54.4 kg   SpO2 100%   BMI 19.37 kg/m   Awake, alert, oriented  Speech fluent, appropriate  CN grossly intact  5/5 BUE/BLE   IMPRESSION:  57 y.o. female s/p Surpass embolization LICA aneurysm, doing well  PLAN: - Will admit for observation overnight - Cont ASA/Plavix for 6 months

## 2017-12-09 NOTE — Transfer of Care (Signed)
Immediate Anesthesia Transfer of Care Note  Patient: Michelle Montes  Procedure(s) Performed: Embolization of aneurysm (N/A )  Patient Location: PACU  Anesthesia Type:General  Level of Consciousness: awake, alert , oriented and patient cooperative  Airway & Oxygen Therapy: Patient Spontanous Breathing and Patient connected to nasal cannula oxygen  Post-op Assessment: Report given to RN, Post -op Vital signs reviewed and stable and Patient moving all extremities X 4  Post vital signs: Reviewed and stable  Last Vitals:  Vitals Value Taken Time  BP 141/78 12/09/2017  4:02 PM  Temp    Pulse 77 12/09/2017  4:03 PM  Resp 13 12/09/2017  4:03 PM  SpO2 100 % 12/09/2017  4:03 PM  Vitals shown include unvalidated device data.  Last Pain:  Vitals:   12/09/17 1039  TempSrc: Oral         Complications: No apparent anesthesia complications

## 2017-12-09 NOTE — Anesthesia Preprocedure Evaluation (Signed)
Anesthesia Evaluation  Patient identified by MRN, date of birth, ID band Patient awake    Reviewed: Allergy & Precautions, NPO status , Patient's Chart, lab work & pertinent test results  Airway Mallampati: II  TM Distance: >3 FB Neck ROM: Full    Dental no notable dental hx.    Pulmonary neg pulmonary ROS, Current Smoker,    Pulmonary exam normal breath sounds clear to auscultation       Cardiovascular hypertension, Pt. on medications negative cardio ROS Normal cardiovascular exam Rhythm:Regular Rate:Normal     Neuro/Psych CVA, No Residual Symptoms negative neurological ROS  negative psych ROS   GI/Hepatic negative GI ROS, Neg liver ROS,   Endo/Other  negative endocrine ROS  Renal/GU negative Renal ROS  negative genitourinary   Musculoskeletal negative musculoskeletal ROS (+)   Abdominal   Peds negative pediatric ROS (+)  Hematology negative hematology ROS (+)   Anesthesia Other Findings   Reproductive/Obstetrics negative OB ROS                             Anesthesia Physical Anesthesia Plan  ASA: III  Anesthesia Plan: General   Post-op Pain Management:    Induction: Intravenous  PONV Risk Score and Plan: 2 and Ondansetron and Midazolam  Airway Management Planned: Oral ETT  Additional Equipment: Arterial line  Intra-op Plan:   Post-operative Plan: Extubation in OR  Informed Consent: I have reviewed the patients History and Physical, chart, labs and discussed the procedure including the risks, benefits and alternatives for the proposed anesthesia with the patient or authorized representative who has indicated his/her understanding and acceptance.   Dental advisory given  Plan Discussed with: CRNA  Anesthesia Plan Comments:         Anesthesia Quick Evaluation

## 2017-12-09 NOTE — Anesthesia Procedure Notes (Signed)
Procedure Name: Intubation Date/Time: 12/09/2017 2:01 PM Performed by: Adria Dillonkin, Aslan Montagna A, CRNA Pre-anesthesia Checklist: Patient identified, Emergency Drugs available, Suction available and Patient being monitored Patient Re-evaluated:Patient Re-evaluated prior to induction Oxygen Delivery Method: Circle system utilized Preoxygenation: Pre-oxygenation with 100% oxygen Induction Type: IV induction Ventilation: Mask ventilation without difficulty Laryngoscope Size: Miller and 2 Grade View: Grade I Tube type: Oral Tube size: 7.0 mm Number of attempts: 1 Airway Equipment and Method: Stylet Placement Confirmation: ETT inserted through vocal cords under direct vision,  positive ETCO2,  CO2 detector and breath sounds checked- equal and bilateral Secured at: 22 cm Tube secured with: Tape Dental Injury: Teeth and Oropharynx as per pre-operative assessment

## 2017-12-09 NOTE — Sedation Documentation (Signed)
Pressure dressing and tegaderm/gauze dressing applied to right groin site. Right groin soft. Distal Pulses present.

## 2017-12-09 NOTE — Sedation Documentation (Signed)
Right groin site checked with PACU, RN (clean, dry, intact, soft). Right DP, PT pulses +1/doppler signal present. Left DP (doppler signal present), PT pulses +1/doppler signal present.

## 2017-12-10 NOTE — Progress Notes (Signed)
Foley catheter removed. Pt ambulated to bathroom with standby assistance and voided urine without difficulty. Vital signs stable throughout. No complaints of pain or discomfort at groin.

## 2017-12-10 NOTE — Progress Notes (Signed)
Subjective: Patient reports doing well  Objective: Vital signs in last 24 hours: Temp:  [97.6 F (36.4 C)-98.2 F (36.8 C)] 97.6 F (36.4 C) (08/31 0800) Pulse Rate:  [56-97] 62 (08/31 1000) Resp:  [12-24] 14 (08/31 1000) BP: (86-164)/(58-89) 130/69 (08/31 1000) SpO2:  [96 %-100 %] 100 % (08/31 1000) Arterial Line BP: (103-180)/(40-89) 156/61 (08/31 1000)  Intake/Output from previous day: 08/30 0701 - 08/31 0700 In: 2904.1 [P.O.:200; I.V.:2704.1] Out: 2226 [Urine:2076; Blood:150] Intake/Output this shift: Total I/O In: 555.8 [P.O.:340; I.V.:215.8] Out: 275 [Urine:275]  Physical Exam: Normal exam  Lab Results: Recent Labs    12/09/17 1113  WBC 6.3  HGB 14.2  HCT 45.4  PLT 198   BMET Recent Labs    12/09/17 1113  NA 140  K 5.2*  CL 108  CO2 22  GLUCOSE 89  BUN 10  CREATININE 0.44  CALCIUM 9.2    Studies/Results: No results found.  Assessment/Plan: Discharge home    LOS: 1 day    Garcia Dalzell D, MD 12/10/2017, 10:56 AM

## 2017-12-10 NOTE — Discharge Summary (Signed)
Physician Discharge Summary  Patient ID: Michelle Montes MRN: [id number] DOB/AGE: 58/10/1960 59 y.o.  Admit date: 12/09/2017 Discharge date: 12/10/2017  Admission Diagnoses: Left hemispheric stroke and left Cavernous carotid aneurysm  Discharge Diagnoses: Left hemispheric stroke and left Cavernous carotid aneurysm Active Problems:   Cerebral aneurysm, nonruptured   Discharged Condition: good  Hospital Course: Patient underwent Surpass embolization LICA aneurysm and was admitted to Neuro-ICU for postop care.  She did well with this procedure and was discharged home on the morning of POD 1.   Consults: None  Significant Diagnostic Studies: angiography: Surpass embolization LICA aneurysm  Treatments: surgery: Surpass embolization LICA aneurysm  Discharge Exam: Blood pressure 130/69, pulse 62, temperature 97.6 F (36.4 C), temperature source Oral, resp. rate 14, height 5\' 6"  (1.676 m), weight 54.4 kg, SpO2 100 %. Neurologic: Alert and oriented X 3, normal strength and tone. Normal symmetric reflexes. Normal coordination and gait Wound:CDI  Disposition: Home  Discharge Instructions    Diet - low sodium heart healthy   Complete by:  As directed    Increase activity slowly   Complete by:  As directed      Allergies as of 12/10/2017   No Known Allergies     Medication List    TAKE these medications   aspirin 325 MG EC tablet Take 325 mg by mouth daily.   atorvastatin 80 MG tablet Commonly known as:  LIPITOR Take 80 mg by mouth at bedtime.   clopidogrel 75 MG tablet Commonly known as:  PLAVIX Take 75 mg by mouth daily.   lisinopril 20 MG tablet Commonly known as:  PRINIVIL,ZESTRIL Take 20 mg by mouth daily.   omeprazole 20 MG tablet Commonly known as:  PRILOSEC OTC Take 20 mg by mouth daily as needed (heartburn/indigestion).        Signed: Dorian HeckleSTERN,Lorilynn Lehr D, MD 12/10/2017, 11:01 AM

## 2017-12-10 NOTE — Anesthesia Postprocedure Evaluation (Signed)
Anesthesia Post Note  Patient: Michelle Montes  Procedure(s) Performed: Embolization of aneurysm (N/A )     Patient location during evaluation: PACU Anesthesia Type: General Level of consciousness: awake and alert Pain management: pain level controlled Vital Signs Assessment: post-procedure vital signs reviewed and stable Respiratory status: spontaneous breathing, nonlabored ventilation, respiratory function stable and patient connected to nasal cannula oxygen Cardiovascular status: blood pressure returned to baseline and stable Postop Assessment: no apparent nausea or vomiting Anesthetic complications: no    Last Vitals:  Vitals:   12/10/17 0800 12/10/17 0900  BP: 120/70 111/69  Pulse: 86 71  Resp: 20 15  Temp:    SpO2: 100% 99%    Last Pain:  Vitals:   12/10/17 0939  TempSrc:   PainSc: 0-No pain                 Kennieth RadFitzgerald, Shikha Bibb E

## 2017-12-13 ENCOUNTER — Encounter (HOSPITAL_COMMUNITY): Payer: Self-pay | Admitting: Neurosurgery

## 2017-12-13 ENCOUNTER — Other Ambulatory Visit (HOSPITAL_COMMUNITY): Payer: Self-pay | Admitting: Neurosurgery

## 2017-12-13 DIAGNOSIS — I671 Cerebral aneurysm, nonruptured: Secondary | ICD-10-CM

## 2017-12-20 ENCOUNTER — Encounter (HOSPITAL_COMMUNITY): Payer: Self-pay | Admitting: Neurosurgery

## 2018-05-12 ENCOUNTER — Other Ambulatory Visit: Payer: Self-pay | Admitting: Neurosurgery

## 2018-05-12 ENCOUNTER — Other Ambulatory Visit (HOSPITAL_COMMUNITY): Payer: Self-pay | Admitting: Neurosurgery

## 2018-05-12 DIAGNOSIS — I671 Cerebral aneurysm, nonruptured: Secondary | ICD-10-CM

## 2018-06-01 NOTE — H&P (Signed)
Chief Complaint     Aneurysm  HPI   HPI: Michelle Montes is a 58 y.o. female status post elective treatment internal carotid artery aneurysm via surpass embolization approximately 6 months ago,  Who presents for routine diagnostic angiogram for routine surveillance.  She is without any concerns.  Patient Active Problem List   Diagnosis Date Noted  . 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 Hypertension   . Stroke [facility])    had weakness on left side- no residual now    PSH: Past Surgical History:  Procedure Laterality Date  . CHOLECYSTECTOMY     2004  . IR ANGIO INTRA EXTRACRAN SEL INTERNAL CAROTID BILAT MOD SED  10/14/2017  . IR ANGIO INTRA EXTRACRAN SEL INTERNAL CAROTID UNI L MOD SED  12/09/2017  . IR ANGIO VERTEBRAL SEL VERTEBRAL BILAT MOD SED  10/14/2017  . IR ANGIOGRAM FOLLOW UP STUDY  12/09/2017  . IR TRANSCATH/EMBOLIZ  12/09/2017  . RADIOLOGY WITH ANESTHESIA N/A 12/09/2017   Procedure: Embolization of aneurysm;  Surgeon: Lisbeth Renshaw, MD;  Location: [facility] OR;  Service: Radiology;  Laterality: N/A;  . TUBAL LIGATION     2005  . WRIST SURGERY Left    2017- screws and plates    (Not in a hospital admission)   SH: Social History   Tobacco Use  . Smoking status: Current Every Day Smoker    Packs/day: 1.50    Years: 42.00    Pack years: 63.00    Types: Cigarettes  . Smokeless tobacco: Never Used  Substance Use Topics  . Alcohol use: No  . Drug use: Never    MEDS: Prior to Admission medications   Medication Sig Start Date End Date Taking? Authorizing Provider  aspirin 325 MG EC tablet Take 325 mg by mouth daily. 10/27/17  Yes [provider]  atorvastatin (LIPITOR) 80 MG tablet Take 80 mg by mouth at bedtime. 09/21/17  Yes [provider]  azithromycin (ZITHROMAX) 250 MG tablet Take 250-500 mg by mouth See admin instructions. Take 500 mg on the first day, then take 250 mg daily for 4 days    Yes [provider]  clopidogrel (PLAVIX) 75 MG tablet Take 75 mg by mouth daily. 10/27/17  Yes [provider]  lisinopril (PRINIVIL,ZESTRIL) 20 MG tablet Take 20 mg by mouth daily.  09/21/17  Yes [provider]  omeprazole (PRILOSEC OTC) 20 MG tablet Take 20 mg by mouth daily as needed (heartburn/indigestion).    Yes [provider]    ALLERGY: No Known Allergies  Social History   Tobacco Use  . Smoking status: Current Every Day Smoker    Packs/day: 1.50    Years: 42.00    Pack years: 63.00    Types: Cigarettes  . Smokeless tobacco: Never Used  Substance Use Topics  . Alcohol use: No     No family history on file.   ROS   ROS  Exam   There were no vitals filed for this visit. General appearance: WDWN, NAD Eyes: No scleral injection Cardiovascular: Regular rate and rhythm without murmurs, rubs, gallops. No edema or variciosities. Distal pulses normal. Pulmonary: Effort normal, non-labored breathing Musculoskeletal:     Muscle tone upper extremities: Normal    Muscle tone lower extremities: Normal    Motor exam: Upper Extremities Deltoid Bicep Tricep Grip  Right 5/5 5/5 5/5 5/5  Left 5/5 5/5 5/5 5/5   Lower Extremity IP Quad  PF DF EHL  Right 5/5 5/5 5/5 5/5 5/5  Left 5/5 5/5 5/5 5/5 5/5   Neurological Mental Status:    - Patient is awake, alert, oriented to person, place, month, year, and situation    - Patient is able to give a clear and coherent history.    - No signs of aphasia or neglect Cranial Nerves    - II: Visual Fields are full. PERRL    - III/IV/VI: EOMI without ptosis or diploplia.     - V: Facial sensation is grossly normal    - VII: Facial movement is symmetric.     - VIII: hearing is intact to voice    - X: Uvula elevates symmetrically    - XI: Shoulder shrug is symmetric.    - XII: tongue is midline without atrophy or fasciculations.  Sensory: Sensation grossly intact to LT   Results - Imaging/Labs    No results found for this or any previous visit (from the past 48 hour(s)).  No results found.  Impression/Plan   58 y.o. female   Status post serve past embolization of a left internal carotid artery aneurysm approximately 6 months ago.  We will proceed with follow-up angiogram for routine surveillance.    While in the office risks, benefits and alternatives to the procedure were discussed.  Patient stated in own language understanding and wished to proceed.

## 2018-06-02 ENCOUNTER — Encounter (HOSPITAL_COMMUNITY): Payer: Self-pay | Admitting: Neurosurgery

## 2018-06-02 ENCOUNTER — Other Ambulatory Visit (HOSPITAL_COMMUNITY): Payer: Self-pay | Admitting: Neurosurgery

## 2018-06-02 ENCOUNTER — Ambulatory Visit (HOSPITAL_COMMUNITY)
Admission: RE | Admit: 2018-06-02 | Discharge: 2018-06-02 | Disposition: A | Discharge status: Home / Self Care | Payer: BC Managed Care – PPO | Source: Ambulatory Visit | Attending: Neurosurgery | Admitting: Neurosurgery

## 2018-06-02 ENCOUNTER — Other Ambulatory Visit: Payer: Self-pay

## 2018-06-02 DIAGNOSIS — F17210 Nicotine dependence, cigarettes, uncomplicated: Secondary | ICD-10-CM | POA: Diagnosis not present

## 2018-06-02 DIAGNOSIS — I671 Cerebral aneurysm, nonruptured: Secondary | ICD-10-CM

## 2018-06-02 DIAGNOSIS — Z9851 Tubal ligation status: Secondary | ICD-10-CM | POA: Diagnosis not present

## 2018-06-02 DIAGNOSIS — I10 Essential (primary) hypertension: Secondary | ICD-10-CM | POA: Diagnosis not present

## 2018-06-02 DIAGNOSIS — Z7982 Long term (current) use of aspirin: Secondary | ICD-10-CM | POA: Insufficient documentation

## 2018-06-02 DIAGNOSIS — Z79899 Other long term (current) drug therapy: Secondary | ICD-10-CM | POA: Diagnosis not present

## 2018-06-02 DIAGNOSIS — Z7902 Long term (current) use of antithrombotics/antiplatelets: Secondary | ICD-10-CM | POA: Insufficient documentation

## 2018-06-02 DIAGNOSIS — Z8673 Personal history of transient ischemic attack (TIA), and cerebral infarction without residual deficits: Secondary | ICD-10-CM | POA: Diagnosis not present

## 2018-06-02 HISTORY — PX: IR ANGIO INTRA EXTRACRAN SEL INTERNAL CAROTID UNI L MOD SED: IMG5361

## 2018-06-02 LAB — BASIC METABOLIC PANEL
Anion gap: 7 (ref 5–15)
BUN: 11 mg/dL (ref 6–20)
CO2: 28 mmol/L (ref 22–32)
Calcium: 9.1 mg/dL (ref 8.9–10.3)
Chloride: 106 mmol/L (ref 98–111)
Creatinine, Ser: 0.55 mg/dL (ref 0.44–1.00)
GFR calc Af Amer: >60 mL/min (ref >60)
GFR calc non Af Amer: >60 mL/min (ref >60)
Glucose, Bld: 87 mg/dL (ref 70–99)
POTASSIUM: 3.7 mmol/L (ref 3.5–5.1)
Sodium: 141 mmol/L (ref 135–145)

## 2018-06-02 LAB — CBC WITH DIFFERENTIAL/PLATELET
ABS IMMATURE GRANULOCYTES: 0.03 10*3/uL (ref 0.00–0.07)
Basophils Absolute: 0 10*3/uL (ref 0.0–0.1)
Basophils Relative: 0 %
Eosinophils Absolute: 0.3 10*3/uL (ref 0.0–0.5)
Eosinophils Relative: 3 %
HCT: 44.9 % (ref 36.0–46.0)
HEMOGLOBIN: 14.3 g/dL (ref 12.0–15.0)
Immature Granulocytes: 0 %
LYMPHS PCT: 21 %
Lymphs Abs: 1.9 10*3/uL (ref 0.7–4.0)
MCH: 28.1 pg (ref 26.0–34.0)
MCHC: 31.8 g/dL (ref 30.0–36.0)
MCV: 88.2 fL (ref 80.0–100.0)
Monocytes Absolute: 0.6 10*3/uL (ref 0.1–1.0)
Monocytes Relative: 7 %
NEUTROS ABS: 6.4 10*3/uL (ref 1.7–7.7)
Neutrophils Relative %: 69 %
Platelets: 211 10*3/uL (ref 150–400)
RBC: 5.09 MIL/uL (ref 3.87–5.11)
RDW: 13.5 % (ref 11.5–15.5)
WBC: 9.2 10*3/uL (ref 4.0–10.5)
nRBC: 0 % (ref 0.0–0.2)

## 2018-06-02 LAB — PROTIME-INR
INR: 0.95
Prothrombin Time: 12.6 seconds (ref 11.4–15.2)

## 2018-06-02 LAB — APTT: aPTT: 31 seconds (ref 24–36)

## 2018-06-02 MED ORDER — NITROGLYCERIN 1 MG/10 ML FOR IR/CATH LAB
INTRA_ARTERIAL | Status: AC
  Administered 2018-06-02: 400 ug
  Filled 2018-06-02: qty 10

## 2018-06-02 MED ORDER — HEPARIN SODIUM (PORCINE) 1000 UNIT/ML IJ SOLN
INTRAMUSCULAR | Status: AC | PRN
  Administered 2018-06-02: 3000 [IU] via INTRAVENOUS

## 2018-06-02 MED ORDER — CHLORHEXIDINE GLUCONATE CLOTH 2 % EX PADS: 6.0000 | MEDICATED_PAD | Freq: Once | CUTANEOUS | Status: DC

## 2018-06-02 MED ORDER — FENTANYL CITRATE (PF) 100 MCG/2ML IJ SOLN
INTRAMUSCULAR | Status: AC
  Filled 2018-06-02: qty 2

## 2018-06-02 MED ORDER — VERAPAMIL HCL 2.5 MG/ML IV SOLN
INTRAVENOUS | Status: AC
  Administered 2018-06-02: 2.5 mg
  Filled 2018-06-02: qty 2

## 2018-06-02 MED ORDER — SODIUM CHLORIDE 0.9 % IV SOLN
INTRAVENOUS | Status: AC | PRN
  Administered 2018-06-02: 10 mL/h via INTRAVENOUS

## 2018-06-02 MED ORDER — FENTANYL CITRATE (PF) 100 MCG/2ML IJ SOLN
INTRAMUSCULAR | Status: AC | PRN
  Administered 2018-06-02: 25 ug via INTRAVENOUS

## 2018-06-02 MED ORDER — MIDAZOLAM HCL 2 MG/2ML IJ SOLN
INTRAMUSCULAR | Status: AC | PRN
  Administered 2018-06-02: 1 mg via INTRAVENOUS

## 2018-06-02 MED ORDER — MIDAZOLAM HCL 2 MG/2ML IJ SOLN
INTRAMUSCULAR | Status: AC
  Filled 2018-06-02: qty 2

## 2018-06-02 MED ORDER — LIDOCAINE HCL 1 % IJ SOLN
INTRAMUSCULAR | Status: AC | PRN
  Administered 2018-06-02: 4 mL

## 2018-06-02 MED ORDER — IOPAMIDOL (ISOVUE-300) INJECTION 61%
INTRAVENOUS | Status: AC
  Administered 2018-06-02: 60 mL
  Filled 2018-06-02: qty 100

## 2018-06-02 MED ORDER — LIDOCAINE HCL 1 % IJ SOLN
INTRAMUSCULAR | Status: AC
  Filled 2018-06-02: qty 20

## 2018-06-02 MED ORDER — CEFAZOLIN SODIUM-DEXTROSE 2-4 GM/100ML-% IV SOLN: 2.0000 g | INTRAVENOUS | Status: DC

## 2018-06-02 MED ORDER — HEPARIN SODIUM (PORCINE) 1000 UNIT/ML IJ SOLN
INTRAMUSCULAR | Status: AC
  Filled 2018-06-02: qty 1

## 2018-06-02 NOTE — Discharge Instructions (Signed)
Radial Site Care ° °This sheet gives you information about how to care for yourself after your procedure. Your health care provider may also give you more specific instructions. If you have problems or questions, contact your health care provider. °What can I expect after the procedure? °After the procedure, it is common to have: °· Bruising and tenderness at the catheter insertion area. °Follow these instructions at home: °Medicines °· Take over-the-counter and prescription medicines only as told by your health care provider. °Insertion site care °· Follow instructions from your health care provider about how to take care of your insertion site. Make sure you: °? Wash your hands with soap and water before you change your bandage (dressing). If soap and water are not available, use hand sanitizer. °? Change your dressing as told by your health care provider. °? Leave stitches (sutures), skin glue, or adhesive strips in place. These skin closures may need to stay in place for 2 weeks or longer. If adhesive strip edges start to loosen and curl up, you may trim the loose edges. Do not remove adhesive strips completely unless your health care provider tells you to do that. °· Check your insertion site every day for signs of infection. Check for: °? Redness, swelling, or pain. °? Fluid or blood. °? Pus or a bad smell. °? Warmth. °· Do not take baths, swim, or use a hot tub until your health care provider approves. °· You may shower 24-48 hours after the procedure, or as directed by your health care provider. °? Remove the dressing and gently wash the site with plain soap and water. °? Pat the area dry with a clean towel. °? Do not rub the site. That could cause bleeding. °· Do not apply powder or lotion to the site. °Activity ° °· For 24 hours after the procedure, or as directed by your health care provider: °? Do not flex or bend the affected arm. °? Do not push or pull heavy objects with the affected arm. °? Do not  drive yourself home from the hospital or clinic. You may drive 24 hours after the procedure unless your health care provider tells you not to. °? Do not operate machinery or power tools. °· Do not lift anything that is heavier than 10 lb (4.5 kg), or the limit that you are told, until your health care provider says that it is safe. °· Ask your health care provider when it is okay to: °? Return to work or school. °? Resume usual physical activities or sports. °? Resume sexual activity. °General instructions °· If the catheter site starts to bleed, raise your arm and put firm pressure on the site. If the bleeding does not stop, get help right away. This is a medical emergency. °· If you went home on the same day as your procedure, a responsible adult should be with you for the first 24 hours after you arrive home. °· Keep all follow-up visits as told by your health care provider. This is important. °Contact a health care provider if: °· You have a fever. °· You have redness, swelling, or yellow drainage around your insertion site. °Get help right away if: °· You have unusual pain at the radial site. °· The catheter insertion area swells very fast. °· The insertion area is bleeding, and the bleeding does not stop when you hold steady pressure on the area. °· Your arm or hand becomes pale, cool, tingly, or numb. °These symptoms may represent a serious problem   that is an emergency. Do not wait to see if the symptoms will go away. Get medical help right away. Call your local emergency services (911 in the [place] Do not drive yourself to the hospital. °Summary °· After the procedure, it is common to have bruising and tenderness at the site. °· Follow instructions from your health care provider about how to take care of your radial site wound. Check the wound every day for signs of infection. °· Do not lift anything that is heavier than 10 lb (4.5 kg), or the limit that you are told, until your health care provider says  that it is safe. °This information is not intended to replace advice given to you by your health care provider. Make sure you discuss any questions you have with your health care provider. °Document Released: 05/01/2010 Document Revised: 05/04/2017 Document Reviewed: 05/04/2017 °Elsevier Interactive Patient Education © 2019 [employer] ° °

## 2019-02-27 ENCOUNTER — Other Ambulatory Visit: Payer: Self-pay | Admitting: Neurosurgery

## 2019-02-27 DIAGNOSIS — I671 Cerebral aneurysm, nonruptured: Secondary | ICD-10-CM

## 2019-03-02 ENCOUNTER — Other Ambulatory Visit (HOSPITAL_COMMUNITY)
Admission: RE | Admit: 2019-03-02 | Discharge: 2019-03-02 | Disposition: A | Discharge status: Home / Self Care | Payer: BC Managed Care – PPO | Source: Ambulatory Visit | Attending: Neurosurgery | Admitting: Neurosurgery

## 2019-03-02 DIAGNOSIS — Z01812 Encounter for preprocedural laboratory examination: Secondary | ICD-10-CM | POA: Diagnosis present

## 2019-03-02 DIAGNOSIS — Z20828 Contact with and (suspected) exposure to other viral communicable diseases: Secondary | ICD-10-CM | POA: Insufficient documentation

## 2019-03-04 LAB — NOVEL CORONAVIRUS, NAA (HOSP ORDER, SEND-OUT TO REF LAB; TAT 18-24 HRS): SARS-CoV-2, NAA: NOT DETECTED

## 2019-03-05 NOTE — H&P (Addendum)
Chief Complaint   aneurysm  HPI   HPI: Michelle Montes is a 58 y.o. 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  She presents today for routine diagnostic cerebral angiogram for monitoring.  She is without any concerns. She has been off Plavix, remains on ASA.  Patient Active Problem List   Diagnosis Date Noted  . 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 Hypertension   . Stroke [facility])    had weakness on left side- no residual now    PSH: Past Surgical History:  Procedure Laterality Date  . CHOLECYSTECTOMY     2004  . IR ANGIO INTRA EXTRACRAN SEL INTERNAL CAROTID BILAT MOD SED  10/14/2017  . IR ANGIO INTRA EXTRACRAN SEL INTERNAL CAROTID UNI L MOD SED  12/09/2017  . IR ANGIO INTRA EXTRACRAN SEL INTERNAL CAROTID UNI L MOD SED  06/02/2018  . IR ANGIO VERTEBRAL SEL VERTEBRAL BILAT MOD SED  10/14/2017  . IR ANGIOGRAM FOLLOW UP STUDY  12/09/2017  . IR TRANSCATH/EMBOLIZ  12/09/2017  . RADIOLOGY WITH ANESTHESIA N/A 12/09/2017   Procedure: Embolization of aneurysm;  Surgeon: Consuella Lose, MD;  Location: [facility];  Service: Radiology;  Laterality: N/A;  . TUBAL LIGATION     2005  . WRIST SURGERY Left    2017- screws and plates    (Not in a hospital admission)   SH: Social History   Tobacco Use  . Smoking status: Current Every Day Smoker    Packs/day: 1.50    Years: 42.00    Pack years: 63.00    Types: Cigarettes  . Smokeless tobacco: Never Used  Substance Use Topics  . Alcohol use: No  . Drug use: Never    MEDS: Prior to Admission medications   Medication Sig Start Date End Date Taking? Authorizing Provider  aspirin 325 MG EC tablet Take 325 mg by mouth daily. 10/27/17   [provider]  atorvastatin (LIPITOR) 80 MG tablet Take 80 mg by mouth at bedtime. 09/21/17   [provider]  azithromycin (ZITHROMAX) 250 MG tablet Take 250-500 mg by mouth  See admin instructions. Take 500 mg on the first day, then take 250 mg daily for 4 days    [provider]  clopidogrel (PLAVIX) 75 MG tablet Take 75 mg by mouth daily. 10/27/17   [provider]  lisinopril (PRINIVIL,ZESTRIL) 20 MG tablet Take 20 mg by mouth daily.  09/21/17   [provider]  omeprazole (PRILOSEC OTC) 20 MG tablet Take 20 mg by mouth daily as needed (heartburn/indigestion).     [provider]    ALLERGY: No Known Allergies  Social History   Tobacco Use  . Smoking status: Current Every Day Smoker    Packs/day: 1.50    Years: 42.00    Pack years: 63.00    Types: Cigarettes  . Smokeless tobacco: Never Used  Substance Use Topics  . Alcohol use: No     No family history on file.   ROS   ROS  Exam   There were no vitals filed for this visit. General appearance: WDWN, NAD Eyes: No scleral injection Cardiovascular: Regular rate and rhythm without murmurs, rubs, gallops. No edema or variciosities. Distal pulses normal. Pulmonary: Effort normal, non-labored breathing Musculoskeletal:     Muscle tone upper extremities: Normal    Muscle tone lower extremities: Normal    Motor exam: Upper Extremities Deltoid Bicep  Tricep Grip  Right 5/5 5/5 5/5 5/5  Left 5/5 5/5 5/5 5/5   Lower Extremity IP Quad PF DF EHL  Right 5/5 5/5 5/5 5/5 5/5  Left 5/5 5/5 5/5 5/5 5/5   Neurological Mental Status:    - Patient is awake, alert, oriented to person, place, month, year, and situation    - Patient is able to give a clear and coherent history.    - No signs of aphasia or neglect Cranial Nerves    - II: Visual Fields are full. PERRL    - III/IV/VI: EOMI without ptosis or diploplia.     - V: Facial sensation is grossly normal    - VII: Facial movement is symmetric.     - VIII: hearing is intact to voice    - X: Uvula elevates symmetrically    - XI: Shoulder shrug is symmetric.    - XII: tongue is midline without atrophy or  fasciculations.  Sensory: Sensation grossly intact to LT  Results - Imaging/Labs   No results found for this or any previous visit (from the past 48 hour(s)).  No results found.  IMAGING: Diagnostic cerebral angiogram of February 2020 was reviewed and compared to the procedure in August 2019.  Although my initial interpretation indicated continued filling with stable appearance of the cavernous aneurysm, it appears to me that the aneurysm is in fact noticeably smaller on the February angiogram compared to the initial procedure of August.  No InStent stenosis is seen.  Impression/Plan   58 y.o. 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  We will proceed with continued angiographic surveillance.  Risks, benefits and alternatives were discussed.  Patient stated understanding and wished to proceed.  Lisbeth Renshaw, MD [facility]

## 2019-03-06 ENCOUNTER — Encounter (HOSPITAL_COMMUNITY): Payer: Self-pay | Admitting: Neurosurgery

## 2019-03-06 ENCOUNTER — Ambulatory Visit (HOSPITAL_COMMUNITY)
Admission: RE | Admit: 2019-03-06 | Discharge: 2019-03-06 | Disposition: A | Discharge status: Home / Self Care | Payer: BC Managed Care – PPO | Source: Ambulatory Visit | Attending: Neurosurgery | Admitting: Neurosurgery

## 2019-03-06 ENCOUNTER — Other Ambulatory Visit: Payer: Self-pay | Admitting: Neurosurgery

## 2019-03-06 ENCOUNTER — Other Ambulatory Visit: Payer: Self-pay

## 2019-03-06 DIAGNOSIS — I671 Cerebral aneurysm, nonruptured: Secondary | ICD-10-CM

## 2019-03-06 DIAGNOSIS — Z79899 Other long term (current) drug therapy: Secondary | ICD-10-CM | POA: Insufficient documentation

## 2019-03-06 DIAGNOSIS — Z7902 Long term (current) use of antithrombotics/antiplatelets: Secondary | ICD-10-CM | POA: Insufficient documentation

## 2019-03-06 DIAGNOSIS — Z8673 Personal history of transient ischemic attack (TIA), and cerebral infarction without residual deficits: Secondary | ICD-10-CM | POA: Diagnosis not present

## 2019-03-06 DIAGNOSIS — F17210 Nicotine dependence, cigarettes, uncomplicated: Secondary | ICD-10-CM | POA: Diagnosis not present

## 2019-03-06 DIAGNOSIS — I10 Essential (primary) hypertension: Secondary | ICD-10-CM | POA: Insufficient documentation

## 2019-03-06 HISTORY — PX: IR US GUIDE VASC ACCESS RIGHT: IMG2390

## 2019-03-06 HISTORY — PX: IR ANGIO INTRA EXTRACRAN SEL INTERNAL CAROTID BILAT MOD SED: IMG5363

## 2019-03-06 HISTORY — PX: IR ANGIO VERTEBRAL SEL VERTEBRAL UNI R MOD SED: IMG5368

## 2019-03-06 LAB — URINALYSIS, ROUTINE W REFLEX MICROSCOPIC
Bilirubin Urine: NEGATIVE
Glucose, UA: NEGATIVE mg/dL
Hgb urine dipstick: NEGATIVE
Ketones, ur: NEGATIVE mg/dL
Nitrite: NEGATIVE
Protein, ur: NEGATIVE mg/dL
Specific Gravity, Urine: 1.017 (ref 1.005–1.030)
pH: 6 (ref 5.0–8.0)

## 2019-03-06 LAB — BASIC METABOLIC PANEL
Anion gap: 7 (ref 5–15)
BUN: 11 mg/dL (ref 6–20)
CO2: 27 mmol/L (ref 22–32)
Calcium: 9.3 mg/dL (ref 8.9–10.3)
Chloride: 107 mmol/L (ref 98–111)
Creatinine, Ser: 0.69 mg/dL (ref 0.44–1.00)
GFR calc Af Amer: >60 mL/min (ref >60)
GFR calc non Af Amer: >60 mL/min (ref >60)
Glucose, Bld: 91 mg/dL (ref 70–99)
Potassium: 4.6 mmol/L (ref 3.5–5.1)
Sodium: 141 mmol/L (ref 135–145)

## 2019-03-06 LAB — CBC WITH DIFFERENTIAL/PLATELET
Abs Immature Granulocytes: 0.03 10*3/uL (ref 0.00–0.07)
Basophils Absolute: 0 10*3/uL (ref 0.0–0.1)
Basophils Relative: 0 %
Eosinophils Absolute: 0.2 10*3/uL (ref 0.0–0.5)
Eosinophils Relative: 2 %
HCT: 44.3 % (ref 36.0–46.0)
Hemoglobin: 14.2 g/dL (ref 12.0–15.0)
Immature Granulocytes: 0 %
Lymphocytes Relative: 21 %
Lymphs Abs: 1.8 10*3/uL (ref 0.7–4.0)
MCH: 28.7 pg (ref 26.0–34.0)
MCHC: 32.1 g/dL (ref 30.0–36.0)
MCV: 89.5 fL (ref 80.0–100.0)
Monocytes Absolute: 0.5 10*3/uL (ref 0.1–1.0)
Monocytes Relative: 6 %
Neutro Abs: 5.9 10*3/uL (ref 1.7–7.7)
Neutrophils Relative %: 71 %
Platelets: 196 10*3/uL (ref 150–400)
RBC: 4.95 MIL/uL (ref 3.87–5.11)
RDW: 13.4 % (ref 11.5–15.5)
WBC: 8.5 10*3/uL (ref 4.0–10.5)
nRBC: 0 % (ref 0.0–0.2)

## 2019-03-06 LAB — APTT: aPTT: 29 seconds (ref 24–36)

## 2019-03-06 LAB — PROTIME-INR
INR: 0.9 (ref 0.8–1.2)
Prothrombin Time: 12.2 seconds (ref 11.4–15.2)

## 2019-03-06 MED ORDER — HEPARIN SODIUM (PORCINE) 1000 UNIT/ML IJ SOLN
INTRAMUSCULAR | Status: AC
  Filled 2019-03-06: qty 1

## 2019-03-06 MED ORDER — FENTANYL CITRATE (PF) 100 MCG/2ML IJ SOLN
INTRAMUSCULAR | Status: AC
  Filled 2019-03-06: qty 2

## 2019-03-06 MED ORDER — CEFAZOLIN SODIUM-DEXTROSE 2-4 GM/100ML-% IV SOLN: 2.0000 g | INTRAVENOUS | Status: DC

## 2019-03-06 MED ORDER — HEPARIN SODIUM (PORCINE) 1000 UNIT/ML IJ SOLN
INTRAMUSCULAR | Status: AC | PRN
  Administered 2019-03-06: 3000 [IU] via INTRAVENOUS

## 2019-03-06 MED ORDER — VERAPAMIL HCL 2.5 MG/ML IV SOLN
INTRAVENOUS | Status: AC
  Filled 2019-03-06: qty 2

## 2019-03-06 MED ORDER — CHLORHEXIDINE GLUCONATE CLOTH 2 % EX PADS: 6.0000 | MEDICATED_PAD | Freq: Once | CUTANEOUS | Status: DC

## 2019-03-06 MED ORDER — FENTANYL CITRATE (PF) 100 MCG/2ML IJ SOLN
INTRAMUSCULAR | Status: AC | PRN
  Administered 2019-03-06: 25 ug via INTRAVENOUS

## 2019-03-06 MED ORDER — NITROGLYCERIN 1 MG/10 ML FOR IR/CATH LAB
INTRA_ARTERIAL | Status: AC
  Filled 2019-03-06: qty 10

## 2019-03-06 MED ORDER — IOHEXOL 300 MG/ML  SOLN
100.0000 mL | Freq: Once | INTRAMUSCULAR | Status: AC | PRN
  Administered 2019-03-06: 35 mL via INTRA_ARTERIAL

## 2019-03-06 MED ORDER — LIDOCAINE HCL 1 % IJ SOLN
INTRAMUSCULAR | Status: AC
  Filled 2019-03-06: qty 20

## 2019-03-06 MED ORDER — MIDAZOLAM HCL 2 MG/2ML IJ SOLN
INTRAMUSCULAR | Status: AC
  Filled 2019-03-06: qty 2

## 2019-03-06 MED ORDER — SODIUM CHLORIDE 0.9 % IV SOLN: INTRAVENOUS | Status: DC

## 2019-03-06 MED ORDER — HYDROCODONE-ACETAMINOPHEN 5-325 MG PO TABS: 1.0000 | ORAL_TABLET | ORAL | Status: DC | PRN

## 2019-03-06 MED ORDER — NITROGLYCERIN 1 MG/10 ML FOR IR/CATH LAB
INTRA_ARTERIAL | Status: AC | PRN
  Administered 2019-03-06: 400 ug via INTRA_ARTERIAL

## 2019-03-06 MED ORDER — MIDAZOLAM HCL 2 MG/2ML IJ SOLN
INTRAMUSCULAR | Status: AC | PRN
  Administered 2019-03-06: 1 mg via INTRAVENOUS

## 2019-03-06 MED ORDER — VERAPAMIL HCL 2.5 MG/ML IV SOLN
INTRAVENOUS | Status: AC | PRN
  Administered 2019-03-06: 2.5 mg via INTRA_ARTERIAL

## 2019-03-06 NOTE — Discharge Instructions (Addendum)
Radial Site Care ° °This sheet gives you information about how to care for yourself after your procedure. Your health care provider may also give you more specific instructions. If you have problems or questions, contact your health care provider. °What can I expect after the procedure? °After the procedure, it is common to have: °· Bruising and tenderness at the catheter insertion area. °Follow these instructions at home: °Medicines °· Take over-the-counter and prescription medicines only as told by your health care provider. °Insertion site care °· Follow instructions from your health care provider about how to take care of your insertion site. Make sure you: °? Wash your hands with soap and water before you change your bandage (dressing). If soap and water are not available, use hand sanitizer. °? Change your dressing as told by your health care provider. °? Leave stitches (sutures), skin glue, or adhesive strips in place. These skin closures may need to stay in place for 2 weeks or longer. If adhesive strip edges start to loosen and curl up, you may trim the loose edges. Do not remove adhesive strips completely unless your health care provider tells you to do that. °· Check your insertion site every day for signs of infection. Check for: °? Redness, swelling, or pain. °? Fluid or blood. °? Pus or a bad smell. °? Warmth. °· Do not take baths, swim, or use a hot tub until your health care provider approves. °· You may shower 24-48 hours after the procedure, or as directed by your health care provider. °? Remove the dressing and gently wash the site with plain soap and water. °? Pat the area dry with a clean towel. °? Do not rub the site. That could cause bleeding. °· Do not apply powder or lotion to the site. °Activity ° °· For 24 hours after the procedure, or as directed by your health care provider: °? Do not flex or bend the affected arm. °? Do not push or pull heavy objects with the affected arm. °? Do not  drive yourself home from the hospital or clinic. You may drive 24 hours after the procedure unless your health care provider tells you not to. °? Do not operate machinery or power tools. °· Do not lift anything that is heavier than 10 lb (4.5 kg), or the limit that you are told, until your health care provider says that it is safe. °· Ask your health care provider when it is okay to: °? Return to work or school. °? Resume usual physical activities or sports. °? Resume sexual activity. °General instructions °· If the catheter site starts to bleed, raise your arm and put firm pressure on the site. If the bleeding does not stop, get help right away. This is a medical emergency. °· If you went home on the same day as your procedure, a responsible adult should be with you for the first 24 hours after you arrive home. °· Keep all follow-up visits as told by your health care provider. This is important. °Contact a health care provider if: °· You have a fever. °· You have redness, swelling, or yellow drainage around your insertion site. °Get help right away if: °· You have unusual pain at the radial site. °· The catheter insertion area swells very fast. °· The insertion area is bleeding, and the bleeding does not stop when you hold steady pressure on the area. °· Your arm or hand becomes pale, cool, tingly, or numb. °These symptoms may represent a serious problem   that is an emergency. Do not wait to see if the symptoms will go away. Get medical help right away. Call your local emergency services (911 in the [place] Do not drive yourself to the hospital. °Summary °· After the procedure, it is common to have bruising and tenderness at the site. °· Follow instructions from your health care provider about how to take care of your radial site wound. Check the wound every day for signs of infection. °· Do not lift anything that is heavier than 10 lb (4.5 kg), or the limit that you are told, until your health care provider says  that it is safe. °This information is not intended to replace advice given to you by your health care provider. Make sure you discuss any questions you have with your health care provider. °Document Released: 05/01/2010 Document Revised: 05/04/2017 Document Reviewed: 05/04/2017 °Elsevier Patient Education © 2020 [employer] °Cerebral Angiogram, Care After °This sheet gives you information about how to care for yourself after your procedure. Your health care provider may also give you more specific instructions. If you have problems or questions, contact your health care provider. °What can I expect after the procedure? °After your procedure, it is common to have: °· Bruising and tenderness at the catheter insertion site. °· A mild headache. °Follow these instructions at home: °Insertion site care ° °· Follow instructions from your health care provider about how to take care of the insertion site. Make sure you: °? Wash your hands with soap and water before you change your bandage (dressing). If soap and water are not available, use hand sanitizer. °? Change your dressing as told by your health care provider. °? Leave stitches (sutures), skin glue, or adhesive strips in place. These skin closures may need to stay in place for 2 weeks or longer. If adhesive strip edges start to loosen and curl up, you may trim the loose edges. Do not remove adhesive strips completely unless your health care provider tells you to do that. °· Do not apply powder or lotion to the site. °· Do not take baths, swim, or use a hot tub until your health care provider says it is okay to do so. °· You may shower 24-48 hours after the procedure or as told by your health care provider. °? Gently wash the site with plain soap and water. °? Pat the area dry with a clean towel. °? Do not rub the site. This may cause bleeding. °· Check your incision area every day for signs of infection. Check for: °? Redness, swelling, or pain. °? Fluid or  blood. °? Warmth. °? Pus or a bad smell. °Activity °· Rest as told by your health care provider. °· Do not lift anything that is heavier than 10 lb (4.5 kg), or the limit that your health care provider tells you, until he or she says that it is safe. °· Do not drive for 24 hours if you were given a medicine to help you relax (sedative). °· Do not drive or use heavy machinery while taking prescription pain medicine. °General instructions ° °· Return to your normal activities as told by your health care provider. Ask your health care provider what activities are safe for you. °· If the catheter site starts to bleed, lie flat and put pressure on the site. °· Drink enough fluid to keep your urine clear or pale yellow. This helps flush the contrast dye from your body. °· Take over-the-counter and prescription medicines only as told   by your health care provider. °· Keep all follow-up visits as directed by your health care provider. This is important. °Contact a health care provider if: °· You have a fever or chills. °· You have redness, swelling, or more pain around your insertion site. °· You have fluid or blood coming from your insertion site. °· The insertion site feels warm to the touch. °· You have pus or a bad smell coming from your insertion site. °· You have more bruising around the insertion site. °· Blood collects in the tissue around the catheter site (hematoma), and the hematoma may be painful to the touch. °Get help right away if: °· You have vision changes or loss of vision. °· The catheter insertion area swells very fast. °· You have numbness or weakness on one side of your body. °· You have trouble talking, or you have slurred speech or cannot speak (aphasia). °· You feel confused or have trouble remembering. °· You have severe pain at the catheter insertion area. °· The catheter insertion area is bleeding, and the bleeding does not stop after 30 minutes of holding steady pressure on the site. °· The arm  or leg where the catheter was inserted is numb, tingling, or cold. °· You have chest pain. °· You have trouble breathing. °· You have a rash. °· You have trouble using the arm or leg where the catheter was inserted. °These symptoms may represent a serious problem that is an emergency. Do not wait to see if the symptoms will go away. Get medical help right away. Call your local emergency services (911 in [place] Do not drive yourself to the hospital. °Summary °· After your procedure, it is common to have bruising and tenderness at the site where the catheter was inserted. °· If your catheter insertion site begins to bleed, put direct pressure on it until the bleeding stops. °· After your procedure, it is important to rest and drink plenty of fluids. °· Return to your normal activities as told by your health care provider. Ask your health care provider what activities are safe for you. °This information is not intended to replace advice given to you by your health care provider. Make sure you discuss any questions you have with your health care provider. °Document Released: 08/13/2013 Document Revised: 03/11/2017 Document Reviewed: 05/03/2016 °Elsevier Patient Education © 2020 [employer] °Moderate Conscious Sedation, Adult, Care After °These instructions provide you with information about caring for yourself after your procedure. Your health care provider may also give you more specific instructions. Your treatment has been planned according to current medical practices, but problems sometimes occur. Call your health care provider if you have any problems or questions after your procedure. °What can I expect after the procedure? °After your procedure, it is common: °· To feel sleepy for several hours. °· To feel clumsy and have poor balance for several hours. °· To have poor judgment for several hours. °· To vomit if you eat too soon. °Follow these instructions at home: °For at least 24 hours after the  procedure: ° °· Do not: °? Participate in activities where you could fall or become injured. °? Drive. °? Use heavy machinery. °? Drink alcohol. °? Take sleeping pills or medicines that cause drowsiness. °? Make important decisions or sign legal documents. °? Take care of children on your own. °· Rest. °Eating and drinking °· Follow the diet recommended by your health care provider. °· If you vomit: °? Drink water, juice, or soup when   you can drink without vomiting. °? Make sure you have little or no nausea before eating solid foods. °General instructions °· Have a responsible adult stay with you until you are awake and alert. °· Take over-the-counter and prescription medicines only as told by your health care provider. °· If you smoke, do not smoke without supervision. °· Keep all follow-up visits as told by your health care provider. This is important. °Contact a health care provider if: °· You keep feeling nauseous or you keep vomiting. °· You feel light-headed. °· You develop a rash. °· You have a fever. °Get help right away if: °· You have trouble breathing. °This information is not intended to replace advice given to you by your health care provider. Make sure you discuss any questions you have with your health care provider. °Document Released: 01/17/2013 Document Revised: 03/11/2017 Document Reviewed: 07/19/2015 °Elsevier Patient Education © 2020 [employer] ° °

## 2019-03-06 NOTE — Op Note (Signed)
  NEUROSURGERY BRIEF OPERATIVE  NOTE   PREOP DX: LICA aneurysm  POSTOP DX: Same  PROCEDURE: Diagnostic cerebral angiogram  SURGEON: Dr. Consuella Lose, MD  ANESTHESIA: IV Sedation with Local  EBL: Minimal  SPECIMENS: None  COMPLICATIONS: None  CONDITION: Stable to recovery  FINDINGS (Full report in [facility]): 1. Complete occlusion of previously treated cavernous LICA aneurysm ~[id number] after Surpass embolization. No in-stent stenosis is seen. 2. No other aneurysms, AVM, or fistulas seen.

## 2019-03-07 ENCOUNTER — Other Ambulatory Visit: Payer: Self-pay | Admitting: Neurosurgery

## 2019-03-07 ENCOUNTER — Encounter (HOSPITAL_COMMUNITY): Payer: Self-pay

## 2019-03-07 DIAGNOSIS — I671 Cerebral aneurysm, nonruptured: Secondary | ICD-10-CM

## 2019-10-16 ENCOUNTER — Other Ambulatory Visit: Payer: Self-pay | Admitting: Legal Medicine

## 2020-01-14 ENCOUNTER — Other Ambulatory Visit: Payer: Self-pay | Admitting: Legal Medicine

## 2020-07-14 ENCOUNTER — Other Ambulatory Visit: Payer: Self-pay | Admitting: Legal Medicine

## 2020-10-10 ENCOUNTER — Other Ambulatory Visit: Payer: Self-pay | Admitting: Legal Medicine

## 2021-01-15 ENCOUNTER — Telehealth: Payer: Self-pay

## 2021-01-15 NOTE — Telephone Encounter (Signed)
I left a message on both of the numbers listed in the pts chart stated that Michelle Montes is overdue for a routine appointment and before any medications can be sent in she will need to schedule a routine appointment.

## 2021-02-16 ENCOUNTER — Other Ambulatory Visit: Payer: Self-pay

## 2021-02-16 ENCOUNTER — Ambulatory Visit (INDEPENDENT_AMBULATORY_CARE_PROVIDER_SITE_OTHER): Payer: 59 | Admitting: Legal Medicine

## 2021-02-16 ENCOUNTER — Encounter: Payer: Self-pay | Admitting: Legal Medicine

## 2021-02-16 VITALS — BP 146/92 | HR 79 | Temp 98.1°F | Resp 16 | Ht 66.0 in | Wt 124.0 lb

## 2021-02-16 DIAGNOSIS — Z23 Encounter for immunization: Secondary | ICD-10-CM

## 2021-02-16 DIAGNOSIS — E782 Mixed hyperlipidemia: Secondary | ICD-10-CM

## 2021-02-16 DIAGNOSIS — I10 Essential (primary) hypertension: Secondary | ICD-10-CM | POA: Insufficient documentation

## 2021-02-16 DIAGNOSIS — I671 Cerebral aneurysm, nonruptured: Secondary | ICD-10-CM

## 2021-02-16 DIAGNOSIS — F17200 Nicotine dependence, unspecified, uncomplicated: Secondary | ICD-10-CM

## 2021-02-16 DIAGNOSIS — K219 Gastro-esophageal reflux disease without esophagitis: Secondary | ICD-10-CM

## 2021-02-16 HISTORY — DX: Nicotine dependence, unspecified, uncomplicated: F17.200

## 2021-02-16 HISTORY — DX: Essential (primary) hypertension: I10

## 2021-02-16 HISTORY — DX: Mixed hyperlipidemia: E78.2

## 2021-02-16 MED ORDER — ATORVASTATIN CALCIUM 80 MG PO TABS: ORAL_TABLET | ORAL | 2 refills | Status: DC

## 2021-02-16 MED ORDER — OMEPRAZOLE MAGNESIUM 20 MG PO TBEC: 20.0000 mg | DELAYED_RELEASE_TABLET | Freq: Every day | ORAL | 2 refills | Status: AC | PRN

## 2021-02-16 MED ORDER — LISINOPRIL 20 MG PO TABS: ORAL_TABLET | ORAL | 2 refills | Status: DC

## 2021-02-16 NOTE — Progress Notes (Signed)
Subjective:  Patient ID: Michelle Montes, female    DOB: [birth year]  Age: 59 y.o. MRN: [id number]  Chief Complaint  Patient presents with   Hyperlipidemia   Hypertension   Gastroesophageal Reflux    HPI: chronic visit, last seen 2020.  Patient has a shunt for cerbral aneurysm.  Patient presents for follow up of hypertension.  Patient tolerating lisipril well with side effects.  Patient was diagnosed with hypertension 2010 so has been treated for hypertension for 12 years.Patient is working on maintaining diet and exercise regimen and follows up as directed. Complication include none.   Patient presents with hyperlipidemia.  Compliance with treatment has been good; patient takes medicines as directed, maintains low cholesterol diet, follows up as directed, and maintains exercise regimen.  Patient is using atorvastatin without problems.    Current Outpatient Medications on File Prior to Visit  Medication Sig Dispense Refill   aspirin EC 81 MG tablet Take 81 mg by mouth daily. Swallow whole.     No current facility-administered medications on file prior to visit.   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- no residual now   Past Surgical History:  Procedure Laterality Date   CHOLECYSTECTOMY     2004   IR ANGIO INTRA EXTRACRAN SEL INTERNAL CAROTID BILAT MOD SED  10/14/2017   IR ANGIO INTRA EXTRACRAN SEL INTERNAL CAROTID BILAT MOD SED  03/06/2019   IR ANGIO INTRA EXTRACRAN SEL INTERNAL CAROTID UNI L MOD SED  12/09/2017   IR ANGIO INTRA EXTRACRAN SEL INTERNAL CAROTID UNI L MOD SED  06/02/2018   IR ANGIO VERTEBRAL SEL VERTEBRAL BILAT MOD SED  10/14/2017   IR ANGIO VERTEBRAL SEL VERTEBRAL UNI R MOD SED  03/06/2019   IR ANGIOGRAM FOLLOW UP STUDY  12/09/2017   IR TRANSCATH/EMBOLIZ  12/09/2017   IR US GUIDE  VASC ACCESS RIGHT  03/06/2019   RADIOLOGY WITH ANESTHESIA N/A 12/09/2017   Procedure: Embolization of aneurysm;  Surgeon: Lisbeth Renshaw, MD;  Location: [facility] OR;  Service: Radiology;  Laterality: N/A;   TUBAL LIGATION     2005   WRIST SURGERY Left    2017- screws and plates    Family History  Problem Relation Age of Onset   Hypertension Mother    Heart attack Mother    Hypertension Father    Heart attack Father    Heart failure Brother    Social History   Socioeconomic History   Marital status: Married    Spouse name: Not on file   Number of children: Not on file   Years of education: Not on file   Highest education level: Not on file  Occupational History   Not on file  Tobacco Use   Smoking status: Every Day    Packs/day: 1.50    Years: 42.00    Pack years: 63.00    Types: Cigarettes   Smokeless tobacco: Never  Vaping Use   Vaping Use: Never used  Substance and Sexual Activity   Alcohol use: Yes    Alcohol/week: 2.0 standard drinks    Types: 2 Cans of beer per week    Comment: socially   Drug use: Never   Sexual activity: Yes    Partners: Male  Other Topics Concern   Not on file  Social History Narrative  Not on file   Social Determinants of Health   Financial Resource Strain: Not on file  Food Insecurity: Not on file  Transportation Needs: Not on file  Physical Activity: Not on file  Stress: Not on file  Social Connections: Not on file    Review of Systems  Constitutional:  Negative for chills, fatigue and fever.  HENT:  Negative for congestion, ear pain and sore throat.   Respiratory:  Negative for cough and shortness of breath.   Cardiovascular:  Negative for chest pain and palpitations.  Gastrointestinal:  Negative for abdominal pain, constipation, diarrhea, nausea and vomiting.  Endocrine: Negative for polydipsia, polyphagia and polyuria.  Genitourinary:  Negative for difficulty urinating and dysuria.  Musculoskeletal:  Negative for  arthralgias, back pain and myalgias.  Skin:  Negative for rash.  Neurological:  Negative for headaches.  Psychiatric/Behavioral:  Negative for dysphoric mood. The patient is not nervous/anxious.     Objective:  BP (!) 146/92   Pulse 79   Temp 98.1 F (36.7 C)   Resp 16   Ht 5\' 6"  (1.676 m)   Wt 124 lb (56.2 kg)   SpO2 98%   BMI 20.01 kg/m   BP/Weight 02/16/2021 03/06/2019 06/02/2018  Systolic BP 146 120 127  Diastolic BP 92 68 70  Wt. (Lbs) 124 123 123  BMI 20.01 19.85 20.47    Physical Exam Vitals reviewed.  Constitutional:      General: She is not in acute distress.    Appearance: Normal appearance.  HENT:     Right Ear: Tympanic membrane normal.     Left Ear: Tympanic membrane normal.     Nose: Nose normal.     Mouth/Throat:     Mouth: Mucous membranes are moist.  Eyes:     Extraocular Movements: Extraocular movements intact.     Conjunctiva/sclera: Conjunctivae normal.     Pupils: Pupils are equal, round, and reactive to light.  Cardiovascular:     Rate and Rhythm: Normal rate and regular rhythm.     Pulses: Normal pulses.     Heart sounds: Normal heart sounds. No murmur heard.   No gallop.  Pulmonary:     Effort: Pulmonary effort is normal. No respiratory distress.     Breath sounds: No wheezing.  Abdominal:     General: Abdomen is flat. Bowel sounds are normal. There is no distension.     Palpations: Abdomen is soft.     Tenderness: There is no abdominal tenderness.  Musculoskeletal:        General: Normal range of motion.     Cervical back: Normal range of motion and neck supple.     Right lower leg: No edema.     Left lower leg: No edema.  Skin:    General: Skin is warm and dry.     Capillary Refill: Capillary refill takes less than 2 seconds.  Neurological:     General: No focal deficit present.     Mental Status: She is alert and oriented to person, place, and time. Mental status is at baseline.  Psychiatric:        Mood and Affect: Mood normal.         Thought Content: Thought content normal.        Lab Results  Component Value Date   WBC 8.5 03/06/2019   HGB 14.2 03/06/2019   HCT 44.3 03/06/2019   PLT 196 03/06/2019   GLUCOSE 91 03/06/2019   NA 141 03/06/2019  K 4.6 03/06/2019   CL 107 03/06/2019   CREATININE 0.69 03/06/2019   BUN 11 03/06/2019   CO2 27 03/06/2019   INR 0.9 03/06/2019      Assessment & Plan:   Problem List Items Addressed This Visit       Cardiovascular and Mediastinum   Cerebral aneurysm, nonruptured - Primary   Relevant Medications   aspirin EC 81 MG tablet   atorvastatin (LIPITOR) 80 MG tablet   lisinopril (ZESTRIL) 20 MG tablet Patient has a shunt that work well, aneurysm repaired, no symptoms    Essential (primary) hypertension   Relevant Medications   aspirin EC 81 MG tablet   atorvastatin (LIPITOR) 80 MG tablet   lisinopril (ZESTRIL) 20 MG tablet   Other Relevant Orders   Comprehensive metabolic panel   CBC with Differential/Platelet An individual hypertension care plan was established and reinforced today.  The patient's status was assessed using clinical findings on exam and labs or diagnostic tests. The patient's success at meeting treatment goals on disease specific evidence-based guidelines and found to be well controlled. SELF MANAGEMENT: The patient and I together assessed ways to personally work towards obtaining the recommended goals. RECOMMENDATIONS: avoid decongestants found in common cold remedies, decrease consumption of alcohol, perform routine monitoring of BP with home BP cuff, exercise, reduction of dietary salt, take medicines as prescribed, try not to miss doses and quit smoking.  Regular exercise and maintaining a healthy weight is needed.  Stress reduction may help. A CLINICAL SUMMARY including written plan identify barriers to care unique to individual due to social or financial issues.  We attempt to mutually creat solutions for individual and family  understanding.      Other   Mixed hyperlipidemia   Relevant Medications   aspirin EC 81 MG tablet   atorvastatin (LIPITOR) 80 MG tablet   lisinopril (ZESTRIL) 20 MG tablet   Other Relevant Orders   Lipid panel   TSH AN INDIVIDUAL CARE PLAN for hyperlipidemia/ cholesterol was established and reinforced today.  The patient's status was assessed using clinical findings on exam, lab and other diagnostic tests. The patient's disease status was assessed based on evidence-based guidelines and found to be well controlled. MEDICATIONS were reviewed. SELF MANAGEMENT GOALS have been discussed and patient's success at attaining the goal of low cholesterol was assessed. RECOMMENDATION given include regular exercise 3 days a week and low cholesterol/low fat diet. CLINICAL SUMMARY including written plan to identify barriers unique to the patient due to social or economic  reasons was discussed.    Nicotine dependence with current use Individual plan was given to patient based on exam, history and other tests and using evidence based criteria for care for smoking cessation.  We discussed behavioral changes to help cessation and offered medicines and counseling to aid in quitting.  Medical consequences of tobacco use were explained.    Other Visit Diagnoses     Gastroesophageal reflux disease without esophagitis       Relevant Medications   omeprazole (PRILOSEC OTC) 20 MG tablet Plan of care was formulated today.  She is doing well.  A plan of care was formulated using patient exam, tests and other sources to optimize care using evidence based information.  Recommend no smoking, no eating after supper, avoid fatty foods, elevate Head of bed, avoid tight fitting clothing.  Continue on OTC.    Encounter for immunization       Relevant Orders   Flu Vaccine MDCK QUAD PF (Completed)     .  30 minute visit with review of old records regarding surgery    Orders Placed This Encounter  Procedures   Flu  Vaccine MDCK QUAD PF   Comprehensive metabolic panel   Lipid panel   TSH   CBC with Differential/Platelet     Follow-up: Return follow up for well woman exam.  An After Visit Summary was printed and given to the patient.  Brent Bulla, MD [facility] [phone]

## 2021-02-17 LAB — COMPREHENSIVE METABOLIC PANEL
ALT: 18 IU/L (ref 0–32)
AST: 17 IU/L (ref 0–40)
Albumin/Globulin Ratio: 2.4 — ABNORMAL HIGH (ref 1.2–2.2)
Albumin: 4.5 g/dL (ref 3.8–4.8)
Alkaline Phosphatase: 142 IU/L — ABNORMAL HIGH (ref 44–121)
BUN/Creatinine Ratio: 24 (ref 12–28)
BUN: 15 mg/dL (ref 8–27)
Bilirubin Total: 0.5 mg/dL (ref 0.0–1.2)
CO2: 25 mmol/L (ref 20–29)
Calcium: 9.3 mg/dL (ref 8.7–10.3)
Chloride: 105 mmol/L (ref 96–106)
Creatinine, Ser: 0.62 mg/dL (ref 0.57–1.00)
Globulin, Total: 1.9 g/dL (ref 1.5–4.5)
Glucose: 87 mg/dL (ref 70–99)
Potassium: 4.8 mmol/L (ref 3.5–5.2)
Sodium: 142 mmol/L (ref 134–144)
Total Protein: 6.4 g/dL (ref 6.0–8.5)
eGFR: 101 mL/min/{1.73_m2} (ref >59)

## 2021-02-17 LAB — CARDIOVASCULAR RISK ASSESSMENT

## 2021-02-17 LAB — CBC WITH DIFFERENTIAL/PLATELET
Basophils Absolute: 0 10*3/uL (ref 0.0–0.2)
Basos: 0 %
EOS (ABSOLUTE): 0.2 10*3/uL (ref 0.0–0.4)
Eos: 2 %
Hematocrit: 42.8 % (ref 34.0–46.6)
Hemoglobin: 14.1 g/dL (ref 11.1–15.9)
Immature Grans (Abs): 0 10*3/uL (ref 0.0–0.1)
Immature Granulocytes: 0 %
Lymphocytes Absolute: 2 10*3/uL (ref 0.7–3.1)
Lymphs: 27 %
MCH: 28.7 pg (ref 26.6–33.0)
MCHC: 32.9 g/dL (ref 31.5–35.7)
MCV: 87 fL (ref 79–97)
Monocytes Absolute: 0.5 10*3/uL (ref 0.1–0.9)
Monocytes: 7 %
Neutrophils Absolute: 4.6 10*3/uL (ref 1.4–7.0)
Neutrophils: 64 %
Platelets: 213 10*3/uL (ref 150–450)
RBC: 4.92 x10E6/uL (ref 3.77–5.28)
RDW: 13.1 % (ref 11.7–15.4)
WBC: 7.4 10*3/uL (ref 3.4–10.8)

## 2021-02-17 LAB — LIPID PANEL
Chol/HDL Ratio: 2.3 ratio (ref 0.0–4.4)
Cholesterol, Total: 137 mg/dL (ref 100–199)
HDL: 60 mg/dL (ref >39)
LDL Chol Calc (NIH): 63 mg/dL (ref 0–99)
Triglycerides: 70 mg/dL (ref 0–149)
VLDL Cholesterol Cal: 14 mg/dL (ref 5–40)

## 2021-02-17 LAB — TSH: TSH: 0.908 u[IU]/mL (ref 0.450–4.500)

## 2021-02-17 NOTE — Progress Notes (Signed)
Kidney and liver tests normal, cholesterol normal, TSH 0.908 normal, cbc normal lp

## 2021-03-03 ENCOUNTER — Encounter: Payer: Self-pay | Admitting: Legal Medicine

## 2021-03-03 ENCOUNTER — Telehealth: Payer: Self-pay | Admitting: Legal Medicine

## 2021-03-03 ENCOUNTER — Ambulatory Visit (INDEPENDENT_AMBULATORY_CARE_PROVIDER_SITE_OTHER): Payer: 59 | Admitting: Legal Medicine

## 2021-03-03 ENCOUNTER — Other Ambulatory Visit: Payer: Self-pay

## 2021-03-03 VITALS — BP 140/86 | HR 78 | Temp 98.1°F | Resp 16 | Ht 66.0 in | Wt 122.0 lb

## 2021-03-03 DIAGNOSIS — Z01419 Encounter for gynecological examination (general) (routine) without abnormal findings: Secondary | ICD-10-CM | POA: Insufficient documentation

## 2021-03-03 DIAGNOSIS — N951 Menopausal and female climacteric states: Secondary | ICD-10-CM

## 2021-03-03 DIAGNOSIS — F17200 Nicotine dependence, unspecified, uncomplicated: Secondary | ICD-10-CM | POA: Diagnosis not present

## 2021-03-03 NOTE — Patient Instructions (Signed)
Steps to Quit Smoking Smoking tobacco is the leading cause of preventable death. It can affect almost every organ in the body. Smoking puts you and people around you at risk for many serious, long-lasting (chronic) diseases. Quitting smoking can be hard, but it is one of the best things that you can do for your health. It is never too late to quit. How do I get ready to quit? When you decide to quit smoking, make a plan to help you succeed. Before you quit: Pick a date to quit. Set a date within the next 2 weeks to give you time to prepare. Write down the reasons why you are quitting. Keep this list in places where you will see it often. Tell your family, friends, and co-workers that you are quitting. Their support is important. Talk with your doctor about the choices that may help you quit. Find out if your health insurance will pay for these treatments. Know the people, places, things, and activities that make you want to smoke (triggers). Avoid them. What first steps can I take to quit smoking? Throw away all cigarettes at home, at work, and in your car. Throw away the things that you use when you smoke, such as ashtrays and lighters. Clean your car. Make sure to empty the ashtray. Clean your home, including curtains and carpets. What can I do to help me quit smoking? Talk with your doctor about taking medicines and seeing a counselor at the same time. You are more likely to succeed when you do both. If you are pregnant or breastfeeding, talk with your doctor about counseling or other ways to quit smoking. Do not take medicine to help you quit smoking unless your doctor tells you to do so. To quit smoking: Quit right away Quit smoking totally, instead of slowly cutting back on how much you smoke over a period of time. Go to counseling. You are more likely to quit if you go to counseling sessions regularly. Take medicine You may take medicines to help you quit. Some medicines need a  prescription, and some you can buy over-the-counter. Some medicines may contain a drug called nicotine to replace the nicotine in cigarettes. Medicines may: Help you to stop having the desire to smoke (cravings). Help to stop the problems that come when you stop smoking (withdrawal symptoms). Your doctor may ask you to use: Nicotine patches, gum, or lozenges. Nicotine inhalers or sprays. Non-nicotine medicine that is taken by mouth. Find resources Find resources and other ways to help you quit smoking and remain smoke-free after you quit. These resources are most helpful when you use them often. They include: Online chats with a counselor. Phone quitlines. Printed self-help materials. Support groups or group counseling. Text messaging programs. Mobile phone apps. Use apps on your mobile phone or tablet that can help you stick to your quit plan. There are many free apps for mobile phones and tablets as well as websites. Examples include Quit Guide from the CDC and smokefree.gov  What things can I do to make it easier to quit?  Talk to your family and friends. Ask them to support and encourage you. Call a phone quitline (1-800-QUIT-NOW), reach out to support groups, or work with a counselor. Ask people who smoke to not smoke around you. Avoid places that make you want to smoke, such as: Bars. Parties. Smoke-break areas at work. Spend time with people who do not smoke. Lower the stress in your life. Stress can make you want to   smoke. Try these things to help your stress: Getting regular exercise. Doing deep-breathing exercises. Doing yoga. Meditating. Doing a body scan. To do this, close your eyes, focus on one area of your body at a time from head to toe. Notice which parts of your body are tense. Try to relax the muscles in those areas. How will I feel when I quit smoking? Day 1 to 3 weeks Within the first 24 hours, you may start to have some problems that come from quitting tobacco.  These problems are very bad 2-3 days after you quit, but they do not often last for more than 2-3 weeks. You may get these symptoms: Mood swings. Feeling restless, nervous, angry, or annoyed. Trouble concentrating. Dizziness. Strong desire for high-sugar foods and nicotine. Weight gain. Trouble pooping (constipation). Feeling like you may vomit (nausea). Coughing or a sore throat. Changes in how the medicines that you take for other issues work in your body. Depression. Trouble sleeping (insomnia). Week 3 and afterward After the first 2-3 weeks of quitting, you may start to notice more positive results, such as: Better sense of smell and taste. Less coughing and sore throat. Slower heart rate. Lower blood pressure. Clearer skin. Better breathing. Fewer sick days. Quitting smoking can be hard. Do not give up if you fail the first time. Some people need to try a few times before they succeed. Do your best to stick to your quit plan, and talk with your doctor if you have any questions or concerns. Summary Smoking tobacco is the leading cause of preventable death. Quitting smoking can be hard, but it is one of the best things that you can do for your health. When you decide to quit smoking, make a plan to help you succeed. Quit smoking right away, not slowly over a period of time. When you start quitting, seek help from your doctor, family, or friends. This information is not intended to replace advice given to you by your health care provider. Make sure you discuss any questions you have with your health care provider. Document Revised: 12/05/2020 Document Reviewed: 06/17/2018 Elsevier Patient Education  2022 Elsevier Inc.  

## 2021-03-03 NOTE — Progress Notes (Signed)
Subjective:  Patient ID: Michelle Montes, female    DOB: [birth year]  Age: 60 y.o. MRN: [id number]  Chief Complaint  Patient presents with   Hyperlipidemia   Hypertension   Gastroesophageal Reflux    HPI   Current Outpatient Medications on File Prior to Visit  Medication Sig Dispense Refill   aspirin EC 81 MG tablet Take 81 mg by mouth daily. Swallow whole.     atorvastatin (LIPITOR) 80 MG tablet TAKE 1 TABLET(80 MG) BY MOUTH EVERY DAY 90 tablet 2   lisinopril (ZESTRIL) 20 MG tablet TAKE 1 TABLET(20 MG) BY MOUTH EVERY DAY 90 tablet 2   omeprazole (PRILOSEC OTC) 20 MG tablet Take 1 tablet (20 mg total) by mouth daily as needed (heartburn/indigestion). 90 tablet 2   No current facility-administered medications on file prior to visit.   Past Medical History:  Diagnosis Date   Essential (primary) hypertension 02/16/2021   Hypertension    Mixed hyperlipidemia 02/16/2021   Nicotine dependence with current use 02/16/2021   Stroke [facility])    had weakness on left side- no residual now   Past Surgical History:  Procedure Laterality Date   CHOLECYSTECTOMY     2004   IR ANGIO INTRA EXTRACRAN SEL INTERNAL CAROTID BILAT MOD SED  10/14/2017   IR ANGIO INTRA EXTRACRAN SEL INTERNAL CAROTID BILAT MOD SED  03/06/2019   IR ANGIO INTRA EXTRACRAN SEL INTERNAL CAROTID UNI L MOD SED  12/09/2017   IR ANGIO INTRA EXTRACRAN SEL INTERNAL CAROTID UNI L MOD SED  06/02/2018   IR ANGIO VERTEBRAL SEL VERTEBRAL BILAT MOD SED  10/14/2017   IR ANGIO VERTEBRAL SEL VERTEBRAL UNI R MOD SED  03/06/2019   IR ANGIOGRAM FOLLOW UP STUDY  12/09/2017   IR TRANSCATH/EMBOLIZ  12/09/2017   IR US GUIDE VASC ACCESS RIGHT  03/06/2019   RADIOLOGY WITH ANESTHESIA N/A 12/09/2017   Procedure: Embolization of aneurysm;  Surgeon: Lisbeth Renshaw, MD;  Location: [facility] OR;  Service: Radiology;  Laterality: N/A;   TUBAL LIGATION     2005   WRIST SURGERY Left    2017- screws and plates    Family History  Problem Relation Age of Onset    Hypertension Mother    Heart attack Mother    Hypertension Father    Heart attack Father    Heart failure Brother    Social History   Socioeconomic History   Marital status: Married    Spouse name: Not on file   Number of children: Not on file   Years of education: Not on file   Highest education level: Not on file  Occupational History   Not on file  Tobacco Use   Smoking status: Every Day    Packs/day: 1.50    Years: 42.00    Pack years: 63.00    Types: Cigarettes   Smokeless tobacco: Never  Vaping Use   Vaping Use: Never used  Substance and Sexual Activity   Alcohol use: Yes    Alcohol/week: 2.0 standard drinks    Types: 2 Cans of beer per week    Comment: socially   Drug use: Never   Sexual activity: Yes    Partners: Male  Other Topics Concern   Not on file  Social History Narrative   Not on file   Social Determinants of Health   Financial Resource Strain: Not on file  Food Insecurity: Not on file  Transportation Needs: Not on file  Physical Activity: Not on file  Stress: Not  on file  Social Connections: Not on file    Review of Systems  Constitutional:  Negative for chills, fatigue and fever.  HENT:  Positive for congestion. Negative for ear pain and sore throat.   Respiratory:  Positive for cough. Negative for shortness of breath.   Cardiovascular:  Negative for chest pain and palpitations.  Gastrointestinal:  Negative for abdominal pain, constipation, diarrhea, nausea and vomiting.  Endocrine: Negative for polydipsia, polyphagia and polyuria.  Genitourinary:  Negative for difficulty urinating and dysuria.  Musculoskeletal:  Negative for arthralgias, back pain and myalgias.  Skin:  Negative for rash.  Neurological:  Negative for headaches.  Psychiatric/Behavioral:  Negative for dysphoric mood. The patient is not nervous/anxious.     Objective:  BP 140/86   Pulse 78   Temp 98.1 F (36.7 C)   Resp 16   Ht 5\' 6"  (1.676 m)   Wt 122 lb (55.3 kg)    SpO2 98%   BMI 19.69 kg/m   BP/Weight 03/03/2021 02/16/2021 03/06/2019  Systolic BP 140 146 120  Diastolic BP 86 92 68  Wt. (Lbs) 122 124 123  BMI 19.69 20.01 19.85    Physical Exam  Diabetic Foot Exam - Simple   No data filed      Lab Results  Component Value Date   WBC 7.4 02/16/2021   HGB 14.1 02/16/2021   HCT 42.8 02/16/2021   PLT 213 02/16/2021   GLUCOSE 87 02/16/2021   CHOL 137 02/16/2021   TRIG 70 02/16/2021   HDL 60 02/16/2021   LDLCALC 63 02/16/2021   ALT 18 02/16/2021   AST 17 02/16/2021   NA 142 02/16/2021   K 4.8 02/16/2021   CL 105 02/16/2021   CREATININE 0.62 02/16/2021   BUN 15 02/16/2021   CO2 25 02/16/2021   TSH 0.908 02/16/2021   INR 0.9 03/06/2019      Assessment & Plan:   Problem List Items Addressed This Visit   None .  No orders of the defined types were placed in this encounter.   No orders of the defined types were placed in this encounter.    Follow-up: No follow-ups on file.  An After Visit Summary was printed and given to the patient.  03/08/2019, MD [facility] [phone]

## 2021-03-03 NOTE — Progress Notes (Signed)
Subjective:  Patient ID: Michelle Montes, female    DOB: 26-Oct-1960  Age: 60 y.o. MRN: [id number]  Chief Complaint  Patient presents with   Hyperlipidemia   Hypertension   Gastroesophageal Reflux    HPI Well Adult Physical: Patient here for a comprehensive physical exam.The patient reports no problems Do you take any herbs or supplements that were not prescribed by a doctor? no Are you taking calcium supplements? no Are you taking aspirin daily? yes  Encounter for general adult medical examination without abnormal findings  Physical ("At Risk" items are starred): Patient's last physical exam was 1 year ago .  Patient is not afflicted from Stress Incontinence and Urge Incontinence  Patient wears a seat belt, has smoke detectors, has carbon monoxide detectors, practices appropriate gun safety, and wears sunscreen with extended sun exposure. Dental Care: biannual cleanings, brushes and flosses daily. Ophthalmology/Optometry: Annual visit.  Hearing loss: none Vision impairments: none Smoking 1/12 PPD x 45 years  Menarche: 11 Menstrual History: irregular LMP: 15 years ago Pregnancy history: G0P0 Safe at home: yes Self breast exams: yes  Patient presents for follow up of hypertension.  Patient tolerating lisinopril well with side effects.  Patient was diagnosed with hypertension 2010 so has been treated for hypertension for 10 years.Patient is working on maintaining diet and exercise regimen and follows up as directed. Complication include none.   Patient presents with hyperlipidemia.  Compliance with treatment has been good; patient takes medicines as directed, maintains low cholesterol diet, follows up as directed, and maintains exercise regimen.  Patient is using atorvastatin without problems.   Flowsheet Row Office Visit from 02/16/2021 in [facility]  PHQ-2 Total Score 3               Social Hx   Social History   Socioeconomic History   Marital status: Married     Spouse name: Not on file   Number of children: Not on file   Years of education: Not on file   Highest education level: Not on file  Occupational History   Not on file  Tobacco Use   Smoking status: Every Day    Packs/day: 1.50    Years: 42.00    Pack years: 63.00    Types: Cigarettes   Smokeless tobacco: Never  Vaping Use   Vaping Use: Never used  Substance and Sexual Activity   Alcohol use: Yes    Alcohol/week: 2.0 standard drinks    Types: 2 Cans of beer per week    Comment: socially   Drug use: Never   Sexual activity: Yes    Partners: Male  Other Topics Concern   Not on file  Social History Narrative   Not on file   Social Determinants of Health   Financial Resource Strain: Not on file  Food Insecurity: Not on file  Transportation Needs: Not on file  Physical Activity: Not on file  Stress: Not on file  Social Connections: Not on file   Past Medical History:  Diagnosis Date   Essential (primary) hypertension 02/16/2021   Hypertension    Mixed hyperlipidemia 02/16/2021   Nicotine dependence with current use 02/16/2021   Stroke (HCC)    had weakness on left side- no residual now   Past Surgical History:  Procedure Laterality Date   CHOLECYSTECTOMY     2004   IR ANGIO INTRA EXTRACRAN SEL INTERNAL CAROTID BILAT MOD SED  10/14/2017   IR ANGIO INTRA EXTRACRAN SEL INTERNAL CAROTID BILAT MOD  SED  03/06/2019   IR ANGIO INTRA EXTRACRAN SEL INTERNAL CAROTID UNI L MOD SED  12/09/2017   IR ANGIO INTRA EXTRACRAN SEL INTERNAL CAROTID UNI L MOD SED  06/02/2018   IR ANGIO VERTEBRAL SEL VERTEBRAL BILAT MOD SED  10/14/2017   IR ANGIO VERTEBRAL SEL VERTEBRAL UNI R MOD SED  03/06/2019   IR ANGIOGRAM FOLLOW UP STUDY  12/09/2017   IR TRANSCATH/EMBOLIZ  12/09/2017   IR US GUIDE VASC ACCESS RIGHT  03/06/2019   RADIOLOGY WITH ANESTHESIA N/A 12/09/2017   Procedure: Embolization of aneurysm;  Surgeon: Consuella Lose, MD;  Location: [facility];  Service: Radiology;  Laterality: N/A;   TUBAL  LIGATION     2005   WRIST SURGERY Left    2017- screws and plates    Family History  Problem Relation Age of Onset   Hypertension Mother    Heart attack Mother    Hypertension Father    Heart attack Father    Heart failure Brother     Review of Systems  Constitutional:  Negative for activity change and appetite change.  HENT:  Negative for congestion and hearing loss.   Eyes:  Negative for visual disturbance.  Respiratory:  Negative for chest tightness, shortness of breath and wheezing.   Gastrointestinal:  Negative for abdominal distention and abdominal pain.  Genitourinary:  Negative for difficulty urinating and dysuria.  Musculoskeletal:  Negative for arthralgias and back pain.  Neurological: Negative.   Psychiatric/Behavioral: Negative.      Objective:  BP 140/86   Pulse 78   Temp 98.1 F (36.7 C)   Resp 16   Ht 5\' 6"  (1.676 m)   Wt 122 lb (55.3 kg)   SpO2 98%   BMI 19.69 kg/m   BP/Weight 03/03/2021 02/16/2021 [id number]  Systolic BP [id number] [id number]  Diastolic BP 86 92 68  Wt. (Lbs) 122 124 123  BMI 19.69 20.01 19.85    Physical Exam Vitals reviewed. Exam conducted with a chaperone present.  Constitutional:      Appearance: Normal appearance.  HENT:     Right Ear: Tympanic membrane, ear canal and external ear normal.     Left Ear: Tympanic membrane, ear canal and external ear normal.  Eyes:     Extraocular Movements: Extraocular movements intact.     Conjunctiva/sclera: Conjunctivae normal.     Pupils: Pupils are equal, round, and reactive to light.  Cardiovascular:     Rate and Rhythm: Normal rate and regular rhythm.     Pulses: Normal pulses.     Heart sounds: Normal heart sounds. No murmur heard.   No gallop.  Pulmonary:     Effort: Pulmonary effort is normal. No respiratory distress.     Breath sounds: Normal breath sounds. No wheezing.  Abdominal:     General: Abdomen is flat. Bowel sounds are normal. There is no distension.     Palpations:  Abdomen is soft.     Tenderness: There is no abdominal tenderness.     Hernia: There is no hernia in the left inguinal area or right inguinal area.  Genitourinary:    Exam position: Lithotomy position.     Pubic Area: No rash or pubic lice.      Tanner stage (genital): 5.     Labia:        Right: No rash, tenderness, lesion or injury.        Left: No rash, tenderness, lesion or injury.      Urethra: No prolapse,  urethral pain, urethral swelling or urethral lesion.  Lymphadenopathy:     Lower Body: No left inguinal adenopathy.  Neurological:     Mental Status: She is alert.    Lab Results  Component Value Date   WBC 7.4 02/16/2021   HGB 14.1 02/16/2021   HCT 42.8 02/16/2021   PLT 213 02/16/2021   GLUCOSE 87 02/16/2021   CHOL 137 02/16/2021   TRIG 70 02/16/2021   HDL 60 02/16/2021   LDLCALC 63 02/16/2021   ALT 18 02/16/2021   AST 17 02/16/2021   NA 142 02/16/2021   K 4.8 02/16/2021   CL 105 02/16/2021   CREATININE 0.62 02/16/2021   BUN 15 02/16/2021   CO2 25 02/16/2021   TSH 0.908 02/16/2021   INR 0.9 03/06/2019      Assessment & Plan:   Problem List Items Addressed This Visit       Other   Nicotine dependence with current use   Relevant Orders   CT CHEST LUNG CA SCREEN LOW DOSE W/O CM Individual plan was given to patient based on exam, history and other tests and using evidence based criteria for care for smoking cessation.  We discussed behavioral changes to help cessation and offered counseling to aid in quitting.  Medical consequences of tobacco use were explained.    Well woman exam Well woman exam performed, everything doing well   Other Visit Diagnoses     Menopausal and female climacteric states    -  Primary   Relevant Orders   IGP,CtNgTv,Apt HPV,rfx16/18,45   MM Digital Screening PAP performed         Body mass index is 19.69 kg/m.   These are the goals we discussed:  Goals   None      This is a list of the screening recommended for  you and due dates:  Health Maintenance  Topic Date Due   Pneumococcal Vaccination (1 - PCV) Never done   Pap Smear  Never done   Colon Cancer Screening  Never done   Mammogram  Never done   Zoster (Shingles) Vaccine (1 of 2) Never done   COVID-19 Vaccine (3 - Booster for Moderna series) 07/01/2020   Hepatitis C Screening: USPSTF Recommendation to screen - Ages 18-79 yo.  03/03/2022*   HIV Screening  03/03/2022*   Tetanus Vaccine  12/26/2021   Flu Shot  Completed   HPV Vaccine  Aged Out  *Topic was postponed. The date shown is not the original due date.     30 minute visit with review of records   Follow-up: Return in about 6 months (around 08/31/2021) for fasting.  An After Visit Summary was printed and given to the patient.  Reinaldo Meeker, MD [facility] [phone]

## 2021-03-03 NOTE — Telephone Encounter (Signed)
   Michelle Montes has been scheduled for the following appointment:  WHAT: CT LUNG SCREEN WHERE: [facility] DATE: 03/18/21 TIME: 1:30PM ARRIVAL TIME  A message has been left for the patient.

## 2021-03-04 NOTE — Telephone Encounter (Signed)
PATIENT CANCELLED

## 2021-03-07 LAB — IGP,CTNGTV,APT HPV,RFX16/18,45
Chlamydia, Nuc. Acid Amp: NEGATIVE
Gonococcus, Nuc. Acid Amp: NEGATIVE
HPV Aptima: NEGATIVE
PAP Smear Comment: 0
Trich vag by NAA: NEGATIVE

## 2021-03-07 NOTE — Progress Notes (Signed)
PAP smear normal lp

## 2021-03-09 ENCOUNTER — Ambulatory Visit
Admission: RE | Admit: 2021-03-09 | Discharge: 2021-03-09 | Disposition: A | Discharge status: Home / Self Care | Payer: 59 | Source: Ambulatory Visit | Attending: Legal Medicine | Admitting: Legal Medicine

## 2021-03-09 ENCOUNTER — Other Ambulatory Visit: Payer: Self-pay

## 2021-03-09 ENCOUNTER — Other Ambulatory Visit: Payer: Self-pay | Admitting: Legal Medicine

## 2021-03-09 DIAGNOSIS — N951 Menopausal and female climacteric states: Secondary | ICD-10-CM

## 2021-09-02 ENCOUNTER — Ambulatory Visit: Payer: 59 | Admitting: Legal Medicine

## 2021-09-30 ENCOUNTER — Encounter: Payer: Self-pay | Admitting: Legal Medicine

## 2021-09-30 ENCOUNTER — Ambulatory Visit (INDEPENDENT_AMBULATORY_CARE_PROVIDER_SITE_OTHER): Payer: Commercial Managed Care - HMO | Admitting: Legal Medicine

## 2021-09-30 VITALS — BP 110/80 | HR 77 | Temp 97.7°F | Resp 15 | Ht 66.0 in | Wt 120.0 lb

## 2021-09-30 DIAGNOSIS — E782 Mixed hyperlipidemia: Secondary | ICD-10-CM | POA: Diagnosis not present

## 2021-09-30 DIAGNOSIS — F17200 Nicotine dependence, unspecified, uncomplicated: Secondary | ICD-10-CM

## 2021-09-30 DIAGNOSIS — I671 Cerebral aneurysm, nonruptured: Secondary | ICD-10-CM | POA: Diagnosis not present

## 2021-09-30 DIAGNOSIS — I10 Essential (primary) hypertension: Secondary | ICD-10-CM | POA: Diagnosis not present

## 2021-09-30 NOTE — Progress Notes (Signed)
Subjective:  Patient ID: Michelle Montes, female    DOB: May 19, 1960  Age: 61 y.o. MRN: [id number]  Chief Complaint  Patient presents with   Hypertension   Hyperlipidemia    HPI: chronic Patient presents with hyperlipidemia.  Compliance with treatment has been good; patient takes medicines as directed, maintains low cholesterol diet, follows up as directed, and maintains exercise regimen.  Patient is using Atorvastatin 80 mg daily without problems.    Patient presents for follow up of hypertension.  Patient tolerating Lisinopril 20 mg daily, aspirin 81 mg daily well without side effects.  Patient was diagnosed with hypertension and has been treated for hypertension for 9 years.Patient is working on maintaining diet and exercise regimen and follows up as directed. Complication include hyperlipidemia.  Chronic smoker  Current Outpatient Medications on File Prior to Visit  Medication Sig Dispense Refill   aspirin EC 81 MG tablet Take 81 mg by mouth daily. Swallow whole.     atorvastatin (LIPITOR) 80 MG tablet TAKE 1 TABLET(80 MG) BY MOUTH EVERY DAY 90 tablet 2   lisinopril (ZESTRIL) 20 MG tablet TAKE 1 TABLET(20 MG) BY MOUTH EVERY DAY 90 tablet 2   omeprazole (PRILOSEC OTC) 20 MG tablet Take 1 tablet (20 mg total) by mouth daily as needed (heartburn/indigestion). 90 tablet 2   No current facility-administered medications on file prior to visit.   Past Medical History:  Diagnosis Date   Essential (primary) hypertension 02/16/2021   Hypertension    Mixed hyperlipidemia 02/16/2021   Nicotine dependence with current use 02/16/2021   Stroke [facility])    had weakness on left side- no residual now   Past Surgical History:  Procedure Laterality Date   CHOLECYSTECTOMY     2004   IR ANGIO INTRA EXTRACRAN SEL INTERNAL CAROTID BILAT MOD SED  10/14/2017   IR ANGIO INTRA EXTRACRAN SEL INTERNAL CAROTID BILAT MOD SED  03/06/2019   IR ANGIO INTRA EXTRACRAN SEL INTERNAL CAROTID UNI L MOD SED  12/09/2017    IR ANGIO INTRA EXTRACRAN SEL INTERNAL CAROTID UNI L MOD SED  06/02/2018   IR ANGIO VERTEBRAL SEL VERTEBRAL BILAT MOD SED  10/14/2017   IR ANGIO VERTEBRAL SEL VERTEBRAL UNI R MOD SED  03/06/2019   IR ANGIOGRAM FOLLOW UP STUDY  12/09/2017   IR TRANSCATH/EMBOLIZ  12/09/2017   IR US GUIDE VASC ACCESS RIGHT  03/06/2019   RADIOLOGY WITH ANESTHESIA N/A 12/09/2017   Procedure: Embolization of aneurysm;  Surgeon: Lisbeth Renshaw, MD;  Location: [facility] OR;  Service: Radiology;  Laterality: N/A;   TUBAL LIGATION     2005   WRIST SURGERY Left    2017- screws and plates    Family History  Problem Relation Age of Onset   Hypertension Mother    Heart attack Mother    Hypertension Father    Heart attack Father    Heart failure Brother    Breast cancer Neg Hx    Social History   Socioeconomic History   Marital status: Married    Spouse name: Not on file   Number of children: 0   Years of education: Not on file   Highest education level: Not on file  Occupational History   Not on file  Tobacco Use   Smoking status: Every Day    Packs/day: 1.50    Years: 42.00    Total pack years: 63.00    Types: Cigarettes   Smokeless tobacco: Never  Vaping Use   Vaping Use: Never used  Substance and Sexual Activity   Alcohol use: Yes    Alcohol/week: 2.0 standard drinks of alcohol    Types: 2 Cans of beer per week    Comment: socially   Drug use: Never   Sexual activity: Yes    Partners: Male  Other Topics Concern   Not on file  Social History Narrative   Not on file   Social Determinants of Health   Financial Resource Strain: Not on file  Food Insecurity: Not on file  Transportation Needs: Not on file  Physical Activity: Not on file  Stress: Not on file  Social Connections: Not on file    Review of Systems  Constitutional:  Negative for chills, fatigue and fever.  HENT:  Negative for congestion, ear pain and sore throat.   Eyes:  Negative for visual disturbance.  Respiratory:  Negative  for cough and shortness of breath.   Cardiovascular:  Negative for chest pain and palpitations.  Gastrointestinal:  Negative for abdominal pain, constipation, diarrhea, nausea and vomiting.  Endocrine: Negative for polydipsia, polyphagia and polyuria.  Genitourinary:  Negative for difficulty urinating and dysuria.  Musculoskeletal:  Negative for arthralgias, back pain and myalgias.  Skin:  Negative for rash.  Neurological:  Negative for headaches.  Psychiatric/Behavioral:  Negative for dysphoric mood. The patient is not nervous/anxious.      Objective:  BP 110/80   Pulse 77   Temp 97.7 F (36.5 C)   Resp 15   Ht 5\' 6"  (1.676 m)   Wt 120 lb (54.4 kg)   SpO2 99%   BMI 19.37 kg/m      09/30/2021    8:42 AM 03/03/2021    8:34 AM 02/16/2021    8:51 AM  BP/Weight  Systolic BP 110 140 146  Diastolic BP 80 86 92  Wt. (Lbs) 120 122 124  BMI 19.37 kg/m2 19.69 kg/m2 20.01 kg/m2    Physical Exam Vitals reviewed.  Constitutional:      General: She is not in acute distress.    Appearance: Normal appearance.  HENT:     Head: Normocephalic.     Right Ear: Tympanic membrane normal.     Left Ear: Tympanic membrane normal.     Nose: Nose normal.     Mouth/Throat:     Mouth: Mucous membranes are moist.     Pharynx: Oropharynx is clear.  Eyes:     Extraocular Movements: Extraocular movements intact.     Conjunctiva/sclera: Conjunctivae normal.     Pupils: Pupils are equal, round, and reactive to light.  Cardiovascular:     Rate and Rhythm: Normal rate and regular rhythm.     Pulses: Normal pulses.     Heart sounds: Normal heart sounds. No murmur heard.    No gallop.  Pulmonary:     Effort: Pulmonary effort is normal. No respiratory distress.     Breath sounds: Normal breath sounds. No wheezing.  Abdominal:     General: Abdomen is flat. Bowel sounds are normal. There is no distension.     Palpations: Abdomen is soft.     Tenderness: There is no abdominal tenderness.   Musculoskeletal:        General: Normal range of motion.     Cervical back: Normal range of motion.     Right lower leg: No edema.     Left lower leg: No edema.  Skin:    General: Skin is warm.     Capillary Refill: Capillary refill takes less than  2 seconds.  Neurological:     General: No focal deficit present.     Mental Status: She is alert and oriented to person, place, and time. Mental status is at baseline.     Gait: Gait normal.     Deep Tendon Reflexes: Reflexes normal.  Psychiatric:        Mood and Affect: Mood normal.         Lab Results  Component Value Date   WBC 7.4 02/16/2021   HGB 14.1 02/16/2021   HCT 42.8 02/16/2021   PLT 213 02/16/2021   GLUCOSE 87 02/16/2021   CHOL 137 02/16/2021   TRIG 70 02/16/2021   HDL 60 02/16/2021   LDLCALC 63 02/16/2021   ALT 18 02/16/2021   AST 17 02/16/2021   NA 142 02/16/2021   K 4.8 02/16/2021   CL 105 02/16/2021   CREATININE 0.62 02/16/2021   BUN 15 02/16/2021   CO2 25 02/16/2021   TSH 0.908 02/16/2021   INR 0.9 03/06/2019      Assessment & Plan:   Problem List Items Addressed This Visit       Cardiovascular and Mediastinum   Cerebral aneurysm, nonruptured   Essential (primary) hypertension - Primary   Relevant Orders   Comprehensive metabolic panel   CBC with Differential/Platelet     Other   Mixed hyperlipidemia   Relevant Orders   Lipid panel   Nicotine dependence with current use  .    Orders Placed This Encounter  Procedures   Comprehensive metabolic panel   Lipid panel   CBC with Differential/Platelet     Follow-up: Return in about 6 months (around 04/01/2022).  An After Visit Summary was printed and given to the patient.  Brent Bulla, MD [facility] [phone]

## 2021-10-01 LAB — LIPID PANEL
Chol/HDL Ratio: 2.2 ratio (ref 0.0–4.4)
Cholesterol, Total: 146 mg/dL (ref 100–199)
HDL: 66 mg/dL (ref >39)
LDL Chol Calc (NIH): 68 mg/dL (ref 0–99)
Triglycerides: 59 mg/dL (ref 0–149)
VLDL Cholesterol Cal: 12 mg/dL (ref 5–40)

## 2021-10-01 LAB — CBC WITH DIFFERENTIAL/PLATELET
Basophils Absolute: 0 10*3/uL (ref 0.0–0.2)
Basos: 1 %
EOS (ABSOLUTE): 0.1 10*3/uL (ref 0.0–0.4)
Eos: 2 %
Hematocrit: 44 % (ref 34.0–46.6)
Hemoglobin: 15 g/dL (ref 11.1–15.9)
Immature Grans (Abs): 0 10*3/uL (ref 0.0–0.1)
Immature Granulocytes: 0 %
Lymphocytes Absolute: 2.1 10*3/uL (ref 0.7–3.1)
Lymphs: 33 %
MCH: 28.6 pg (ref 26.6–33.0)
MCHC: 34.1 g/dL (ref 31.5–35.7)
MCV: 84 fL (ref 79–97)
Monocytes Absolute: 0.4 10*3/uL (ref 0.1–0.9)
Monocytes: 6 %
Neutrophils Absolute: 3.7 10*3/uL (ref 1.4–7.0)
Neutrophils: 58 %
Platelets: 220 10*3/uL (ref 150–450)
RBC: 5.25 x10E6/uL (ref 3.77–5.28)
RDW: 13.1 % (ref 11.7–15.4)
WBC: 6.4 10*3/uL (ref 3.4–10.8)

## 2021-10-01 LAB — COMPREHENSIVE METABOLIC PANEL
ALT: 17 IU/L (ref 0–32)
AST: 15 IU/L (ref 0–40)
Albumin/Globulin Ratio: 2.4 — ABNORMAL HIGH (ref 1.2–2.2)
Albumin: 5 g/dL — ABNORMAL HIGH (ref 3.8–4.8)
Alkaline Phosphatase: 125 IU/L — ABNORMAL HIGH (ref 44–121)
BUN/Creatinine Ratio: 14 (ref 12–28)
BUN: 9 mg/dL (ref 8–27)
Bilirubin Total: 0.6 mg/dL (ref 0.0–1.2)
CO2: 24 mmol/L (ref 20–29)
Calcium: 10 mg/dL (ref 8.7–10.3)
Chloride: 106 mmol/L (ref 96–106)
Creatinine, Ser: 0.64 mg/dL (ref 0.57–1.00)
Globulin, Total: 2.1 g/dL (ref 1.5–4.5)
Glucose: 95 mg/dL (ref 70–99)
Potassium: 4.7 mmol/L (ref 3.5–5.2)
Sodium: 144 mmol/L (ref 134–144)
Total Protein: 7.1 g/dL (ref 6.0–8.5)
eGFR: 99 mL/min/{1.73_m2} (ref >59)

## 2021-10-01 LAB — CARDIOVASCULAR RISK ASSESSMENT

## 2022-01-03 ENCOUNTER — Other Ambulatory Visit: Payer: Self-pay | Admitting: Legal Medicine

## 2022-01-03 DIAGNOSIS — E782 Mixed hyperlipidemia: Secondary | ICD-10-CM

## 2022-04-07 ENCOUNTER — Ambulatory Visit (INDEPENDENT_AMBULATORY_CARE_PROVIDER_SITE_OTHER): Payer: Commercial Managed Care - HMO | Admitting: Family Medicine

## 2022-04-07 ENCOUNTER — Encounter: Payer: Self-pay | Admitting: Family Medicine

## 2022-04-07 VITALS — BP 120/86 | HR 91 | Temp 97.2°F | Ht 65.0 in | Wt 120.8 lb

## 2022-04-07 DIAGNOSIS — Z23 Encounter for immunization: Secondary | ICD-10-CM | POA: Diagnosis not present

## 2022-04-07 DIAGNOSIS — Z1211 Encounter for screening for malignant neoplasm of colon: Secondary | ICD-10-CM | POA: Insufficient documentation

## 2022-04-07 DIAGNOSIS — I69354 Hemiplegia and hemiparesis following cerebral infarction affecting left non-dominant side: Secondary | ICD-10-CM

## 2022-04-07 DIAGNOSIS — Z1159 Encounter for screening for other viral diseases: Secondary | ICD-10-CM | POA: Insufficient documentation

## 2022-04-07 DIAGNOSIS — Z114 Encounter for screening for human immunodeficiency virus [HIV]: Secondary | ICD-10-CM | POA: Insufficient documentation

## 2022-04-07 DIAGNOSIS — I10 Essential (primary) hypertension: Secondary | ICD-10-CM | POA: Diagnosis not present

## 2022-04-07 DIAGNOSIS — E782 Mixed hyperlipidemia: Secondary | ICD-10-CM | POA: Diagnosis not present

## 2022-04-07 DIAGNOSIS — F17200 Nicotine dependence, unspecified, uncomplicated: Secondary | ICD-10-CM

## 2022-04-07 DIAGNOSIS — Z1231 Encounter for screening mammogram for malignant neoplasm of breast: Secondary | ICD-10-CM | POA: Insufficient documentation

## 2022-04-07 NOTE — Progress Notes (Signed)
Subjective:  Patient ID: Michelle Montes, female    DOB: 16-Aug-1960  Age: 61 y.o. MRN: [id number]  Chief Complaint  Patient presents with   Hypertension    HPI Hyperlipidemia: Atorvastatin 80 mg daily.     Hypertension:  Lisinopril 20 mg daily, aspirin 81 mg daily.  Patient has history of 2 strokes.  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 She follows with Dr. Patric Dykes. Recent MRI was good per patient and does not need repeated for 5 years.   Chronic smoker: Patient has no desire to quit.  She did try to quit many years ago however is at 1-1/2 packs a day.  She refuses lung cancer screening.   Current Outpatient Medications on File Prior to Visit  Medication Sig Dispense Refill   aspirin EC 81 MG tablet Take 81 mg by mouth daily. Swallow whole.     atorvastatin (LIPITOR) 80 MG tablet TAKE 1 TABLET(80 MG) BY MOUTH EVERY DAY 90 tablet 2   lisinopril (ZESTRIL) 20 MG tablet TAKE 1 TABLET(20 MG) BY MOUTH EVERY DAY 90 tablet 2   omeprazole (PRILOSEC OTC) 20 MG tablet Take 1 tablet (20 mg total) by mouth daily as needed (heartburn/indigestion). 90 tablet 2   No current facility-administered medications on file prior to visit.   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   Past Surgical History:  Procedure Laterality Date   CHOLECYSTECTOMY     2004   IR ANGIO INTRA EXTRACRAN SEL INTERNAL CAROTID BILAT MOD SED  10/14/2017   IR ANGIO INTRA EXTRACRAN SEL INTERNAL CAROTID BILAT MOD SED  03/06/2019   IR ANGIO INTRA EXTRACRAN SEL INTERNAL CAROTID UNI L MOD SED  12/09/2017   IR ANGIO INTRA EXTRACRAN SEL INTERNAL CAROTID UNI L MOD SED   06/02/2018   IR ANGIO VERTEBRAL SEL VERTEBRAL BILAT MOD SED  10/14/2017   IR ANGIO VERTEBRAL SEL VERTEBRAL UNI R MOD SED  03/06/2019   IR ANGIOGRAM FOLLOW UP STUDY  12/09/2017   IR TRANSCATH/EMBOLIZ  12/09/2017   IR US GUIDE VASC ACCESS RIGHT  03/06/2019   RADIOLOGY WITH ANESTHESIA N/A 12/09/2017   Procedure: Embolization of aneurysm;  Surgeon: Lisbeth Renshaw, MD;  Location: [facility] OR;  Service: Radiology;  Laterality: N/A;   TUBAL LIGATION     2005   WRIST SURGERY Left    2017- screws and plates    Family History  Problem Relation Age of Onset   Hypertension Mother    Heart attack Mother    Hypertension Father    Heart attack Father    Heart attack Brother    Hypertension Brother    Hyperlipidemia Brother    Heart failure Brother    Breast cancer Neg Hx    Social History   Socioeconomic History   Marital status: Married    Spouse name: Not on file   Number of children: 0   Years of education: Not on file   Highest education level: Not on file  Occupational History   Not on file  Tobacco Use   Smoking status: Every Day    Packs/day: 1.50    Years:  42.00    Total pack years: 63.00    Types: Cigarettes   Smokeless tobacco: Never  Vaping Use   Vaping Use: Never used  Substance and Sexual Activity   Alcohol use: Yes    Alcohol/week: 2.0 standard drinks of alcohol    Types: 2 Cans of beer per week    Comment: socially   Drug use: Never   Sexual activity: Yes    Partners: Male  Other Topics Concern   Not on file  Social History Narrative   Not on file   Social Determinants of Health   Financial Resource Strain: Not on file  Food Insecurity: Not on file  Transportation Needs: Not on file  Physical Activity: Not on file  Stress: Not on file  Social Connections: Not on file    Review of Systems  Constitutional:  Negative for fatigue and fever.  HENT:  Negative for congestion, ear pain and sore throat.   Respiratory:  Positive for cough. Negative for shortness  of breath.   Cardiovascular:  Negative for chest pain.  Gastrointestinal:  Negative for abdominal pain, constipation, diarrhea, nausea and vomiting.  Genitourinary:  Negative for dysuria, frequency and urgency.  Musculoskeletal:  Negative for arthralgias, back pain and myalgias.  Neurological:  Negative for dizziness and headaches.  Psychiatric/Behavioral:  Negative for agitation and sleep disturbance. The patient is not nervous/anxious.      Objective:  BP 120/86 (BP Location: Right Arm, Patient Position: Sitting, Cuff Size: Normal)   Pulse 91   Temp (!) 97.2 F (36.2 C) (Temporal)   Ht 5\' 5"  (1.651 m)   Wt 120 lb 12.8 oz (54.8 kg)   SpO2 100%   BMI 20.10 kg/m      04/07/2022    7:36 AM 09/30/2021    8:42 AM 03/03/2021    8:34 AM  BP/Weight  Systolic BP 120 110 140  Diastolic BP 86 80 86  Wt. (Lbs) 120.8 120 122  BMI 20.1 kg/m2 19.37 kg/m2 19.69 kg/m2    Physical Exam Vitals reviewed.  Constitutional:      Appearance: Normal appearance. She is normal weight.  Neck:     Vascular: No carotid bruit.  Cardiovascular:     Rate and Rhythm: Normal rate and regular rhythm.     Heart sounds: Normal heart sounds.  Pulmonary:     Effort: Pulmonary effort is normal.     Breath sounds: Normal breath sounds.  Abdominal:     General: Bowel sounds are normal.     Palpations: Abdomen is soft.     Tenderness: There is no abdominal tenderness.  Musculoskeletal:        General: Normal range of motion.     Cervical back: Normal range of motion.  Skin:    General: Skin is warm.  Neurological:     Mental Status: She is alert and oriented to person, place, and time.  Psychiatric:        Mood and Affect: Mood normal.        Behavior: Behavior normal.     Diabetic Foot Exam - Simple   No data filed      Lab Results  Component Value Date   WBC 6.4 09/30/2021   HGB 15.0 09/30/2021   HCT 44.0 09/30/2021   PLT 220 09/30/2021   GLUCOSE 95 09/30/2021   CHOL 146 09/30/2021    TRIG 59 09/30/2021   HDL 66 09/30/2021   LDLCALC 68 09/30/2021   ALT 17 09/30/2021  AST 15 09/30/2021   NA 144 09/30/2021   K 4.7 09/30/2021   CL 106 09/30/2021   CREATININE 0.64 09/30/2021   BUN 9 09/30/2021   CO2 24 09/30/2021   TSH 0.908 02/16/2021   INR 0.9 03/06/2019      Assessment & Plan:   Problem List Items Addressed This Visit       Cardiovascular and Mediastinum   Essential (primary) hypertension - Primary    Well controlled.  Lisinopril 20 mg daily, Aspirin 81 mg daily Continue to work on eating a healthy diet and exercise.  Labs drawn today.        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  Follows with dr Patric Dykes.         Other   Mixed hyperlipidemia    Well controlled.  No changes to medicines. Continue atorvastatin 80 mg before bed.  Continue to work on eating a healthy diet and exercise.  Labs drawn today.        Relevant Orders   TSH   Lipid panel   Nicotine dependence with current use    Refuses cessation.  Encouraged to try decrease to 1 ppd before next appt.       Encounter for screening for HIV   Relevant Orders   HIV Antibody (routine testing w rflx)   Need for hepatitis C screening test   Relevant Orders   HCV Ab w Reflex to Quant PCR   Flu vaccine need   Relevant Orders   Flu Vaccine MDCK QUAD PF   Need for COVID-19 vaccine   Relevant Orders   Pfizer Fall 2023 Covid-19 Vaccine 56yrs and older   Visit for screening mammogram   Relevant Orders   MM DIGITAL SCREENING BILATERAL   Colon cancer screening   Relevant Orders   Cologuard  .  No orders of the defined types were placed in this encounter.   Orders Placed This Encounter  Procedures   MM DIGITAL SCREENING BILATERAL   Flu Vaccine MDCK QUAD PF   Pfizer Fall 2023 Covid-19 Vaccine  32yrs and older   CBC with Differential/Platelet   Comprehensive metabolic panel   TSH   Lipid panel   HCV Ab w Reflex to Quant PCR   HIV Antibody (routine testing w rflx)   Cologuard     Follow-up: Return in about 6 months (around 10/07/2022) for cpe fasting.  An After Visit Summary was printed and given to the patient.  Blane Ohara, MD [facility] [phone]

## 2022-04-07 NOTE — Assessment & Plan Note (Signed)
Well controlled.  Lisinopril 20 mg daily, Aspirin 81 mg daily Continue to work on eating a healthy diet and exercise.  Labs drawn today.

## 2022-04-07 NOTE — Assessment & Plan Note (Signed)
Refuses cessation.  Encouraged to try decrease to 1 ppd before next appt.

## 2022-04-07 NOTE — Assessment & Plan Note (Signed)
Minimal residual symptoms.  Follows with dr Patric Dykes.

## 2022-04-07 NOTE — Assessment & Plan Note (Signed)
Well controlled.  No changes to medicines. Continue atorvastatin 80 mg before bed.  Continue to work on eating a healthy diet and exercise.  Labs drawn today.   

## 2022-04-07 NOTE — Patient Instructions (Signed)
I included some information about lung cancer screening. If you change your mind, please call us.   Happy New Year!  Dr. Sedalia Muta

## 2022-04-08 LAB — CARDIOVASCULAR RISK ASSESSMENT

## 2022-04-08 LAB — CBC WITH DIFFERENTIAL/PLATELET
Basophils Absolute: 0 10*3/uL (ref 0.0–0.2)
Basos: 0 %
EOS (ABSOLUTE): 0.1 10*3/uL (ref 0.0–0.4)
Eos: 1 %
Hematocrit: 40.3 % (ref 34.0–46.6)
Hemoglobin: 13.3 g/dL (ref 11.1–15.9)
Immature Grans (Abs): 0 10*3/uL (ref 0.0–0.1)
Immature Granulocytes: 0 %
Lymphocytes Absolute: 1.8 10*3/uL (ref 0.7–3.1)
Lymphs: 32 %
MCH: 27.2 pg (ref 26.6–33.0)
MCHC: 33 g/dL (ref 31.5–35.7)
MCV: 82 fL (ref 79–97)
Monocytes Absolute: 0.7 10*3/uL (ref 0.1–0.9)
Monocytes: 12 %
Neutrophils Absolute: 3.1 10*3/uL (ref 1.4–7.0)
Neutrophils: 55 %
Platelets: 312 10*3/uL (ref 150–450)
RBC: 4.89 x10E6/uL (ref 3.77–5.28)
RDW: 13.3 % (ref 11.7–15.4)
WBC: 5.7 10*3/uL (ref 3.4–10.8)

## 2022-04-08 LAB — COMPREHENSIVE METABOLIC PANEL
ALT: 23 IU/L (ref 0–32)
AST: 17 IU/L (ref 0–40)
Albumin/Globulin Ratio: 2.2 (ref 1.2–2.2)
Albumin: 4.6 g/dL (ref 3.9–4.9)
Alkaline Phosphatase: 127 IU/L — ABNORMAL HIGH (ref 44–121)
BUN/Creatinine Ratio: 22 (ref 12–28)
BUN: 13 mg/dL (ref 8–27)
Bilirubin Total: 0.4 mg/dL (ref 0.0–1.2)
CO2: 21 mmol/L (ref 20–29)
Calcium: 9.7 mg/dL (ref 8.7–10.3)
Chloride: 104 mmol/L (ref 96–106)
Creatinine, Ser: 0.6 mg/dL (ref 0.57–1.00)
Globulin, Total: 2.1 g/dL (ref 1.5–4.5)
Glucose: 88 mg/dL (ref 70–99)
Potassium: 4.4 mmol/L (ref 3.5–5.2)
Sodium: 143 mmol/L (ref 134–144)
Total Protein: 6.7 g/dL (ref 6.0–8.5)
eGFR: 101 mL/min/{1.73_m2} (ref >59)

## 2022-04-08 LAB — LIPID PANEL
Chol/HDL Ratio: 2.1 ratio (ref 0.0–4.4)
Cholesterol, Total: 148 mg/dL (ref 100–199)
HDL: 70 mg/dL (ref >39)
LDL Chol Calc (NIH): 67 mg/dL (ref 0–99)
Triglycerides: 53 mg/dL (ref 0–149)
VLDL Cholesterol Cal: 11 mg/dL (ref 5–40)

## 2022-04-08 LAB — TSH: TSH: 0.723 u[IU]/mL (ref 0.450–4.500)

## 2022-04-08 LAB — HIV ANTIBODY (ROUTINE TESTING W REFLEX): HIV Screen 4th Generation wRfx: NONREACTIVE

## 2022-04-08 LAB — HCV INTERPRETATION

## 2022-04-08 LAB — HCV AB W REFLEX TO QUANT PCR: HCV Ab: NONREACTIVE

## 2022-04-09 ENCOUNTER — Other Ambulatory Visit: Payer: Self-pay | Admitting: Legal Medicine

## 2022-04-09 DIAGNOSIS — I10 Essential (primary) hypertension: Secondary | ICD-10-CM

## 2022-04-21 ENCOUNTER — Ambulatory Visit
Admission: RE | Admit: 2022-04-21 | Discharge: 2022-04-21 | Disposition: A | Discharge status: Home / Self Care | Payer: Commercial Managed Care - HMO | Source: Ambulatory Visit | Attending: Family Medicine | Admitting: Family Medicine

## 2022-04-21 DIAGNOSIS — Z1231 Encounter for screening mammogram for malignant neoplasm of breast: Secondary | ICD-10-CM

## 2022-04-22 ENCOUNTER — Other Ambulatory Visit: Payer: Self-pay

## 2022-04-22 DIAGNOSIS — R928 Other abnormal and inconclusive findings on diagnostic imaging of breast: Secondary | ICD-10-CM

## 2022-04-23 ENCOUNTER — Other Ambulatory Visit: Payer: Self-pay | Admitting: Family Medicine

## 2022-04-23 DIAGNOSIS — R928 Other abnormal and inconclusive findings on diagnostic imaging of breast: Secondary | ICD-10-CM

## 2022-04-28 ENCOUNTER — Ambulatory Visit: Admission: RE | Admit: 2022-04-28 | Payer: Commercial Managed Care - HMO | Source: Ambulatory Visit

## 2022-04-28 ENCOUNTER — Ambulatory Visit
Admission: RE | Admit: 2022-04-28 | Discharge: 2022-04-28 | Disposition: A | Discharge status: Home / Self Care | Payer: Commercial Managed Care - HMO | Source: Ambulatory Visit | Attending: Family Medicine | Admitting: Family Medicine

## 2022-04-28 DIAGNOSIS — R928 Other abnormal and inconclusive findings on diagnostic imaging of breast: Secondary | ICD-10-CM

## 2022-07-19 IMAGING — MG MM DIGITAL SCREENING BILAT W/ TOMO AND CAD
6 of 10 series · 6 of 30 positions shown · non-contrast
Comparison: Previous exam(s).

CLINICAL DATA: Screening.

EXAM:
DIGITAL SCREENING BILATERAL MAMMOGRAM WITH TOMOSYNTHESIS AND CAD
TECHNIQUE: Bilateral screening digital craniocaudal and mediolateral oblique
mammograms were obtained. Bilateral screening digital breast
tomosynthesis was performed. The images were evaluated with
computer-aided detection.

[L MLO synth-2D (1 of 2)]
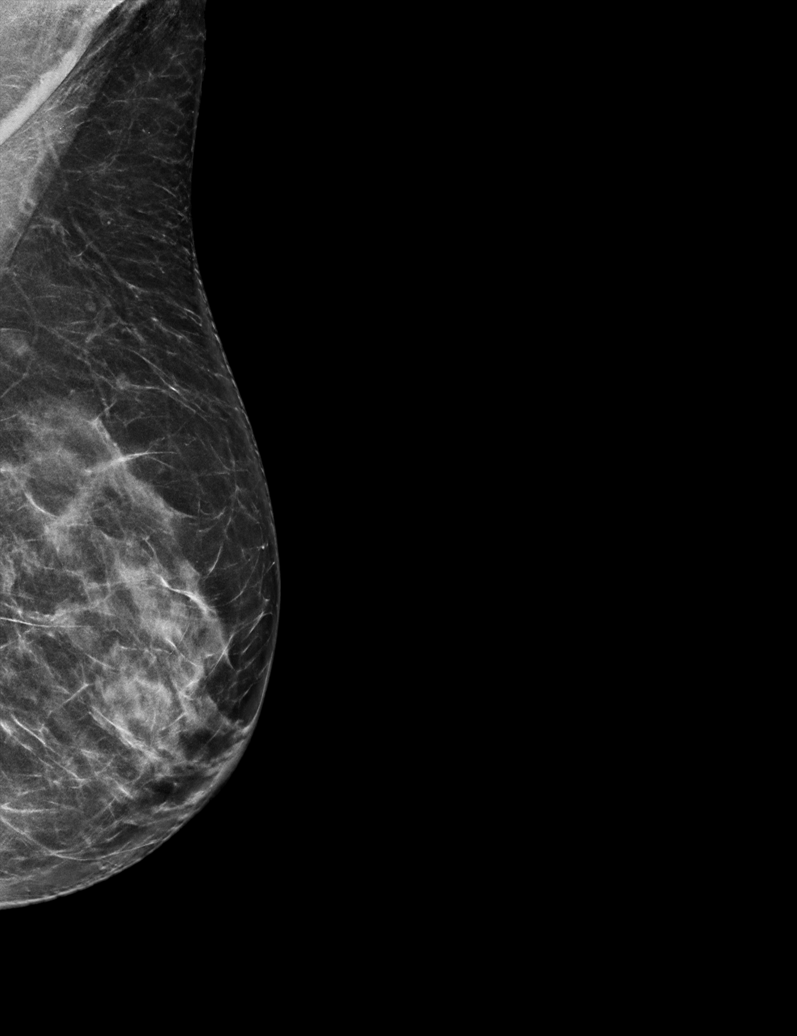

[L MLO synth-2D (2 of 2)]
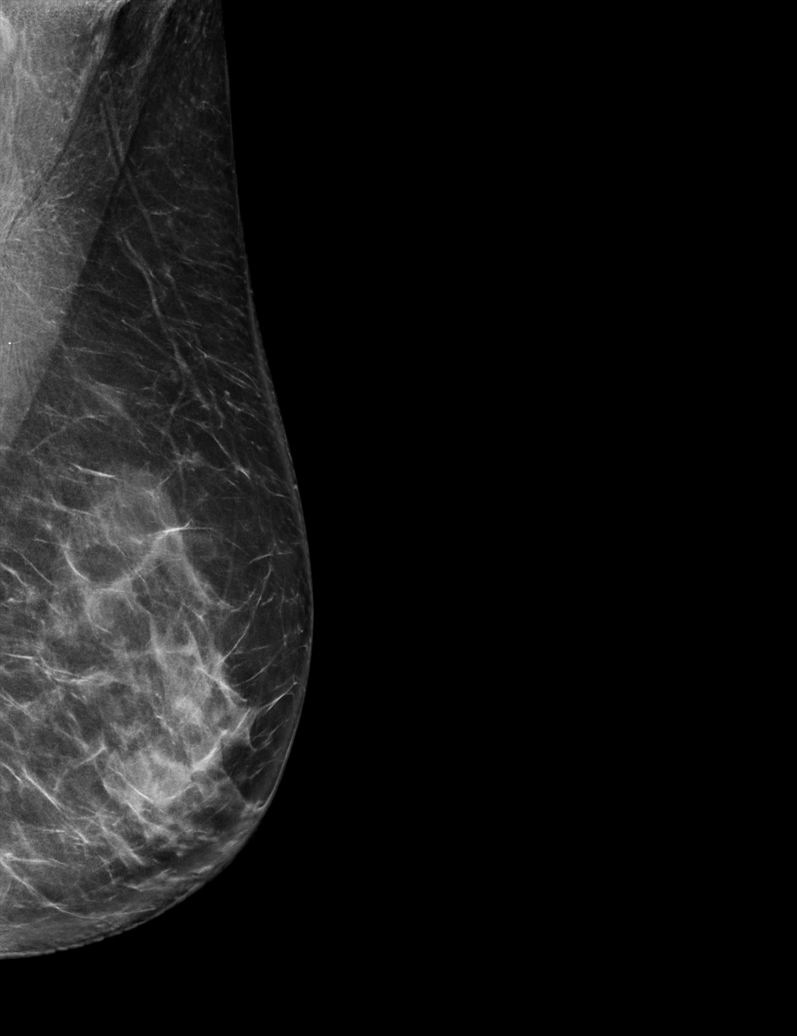

[R CC synth-2D]
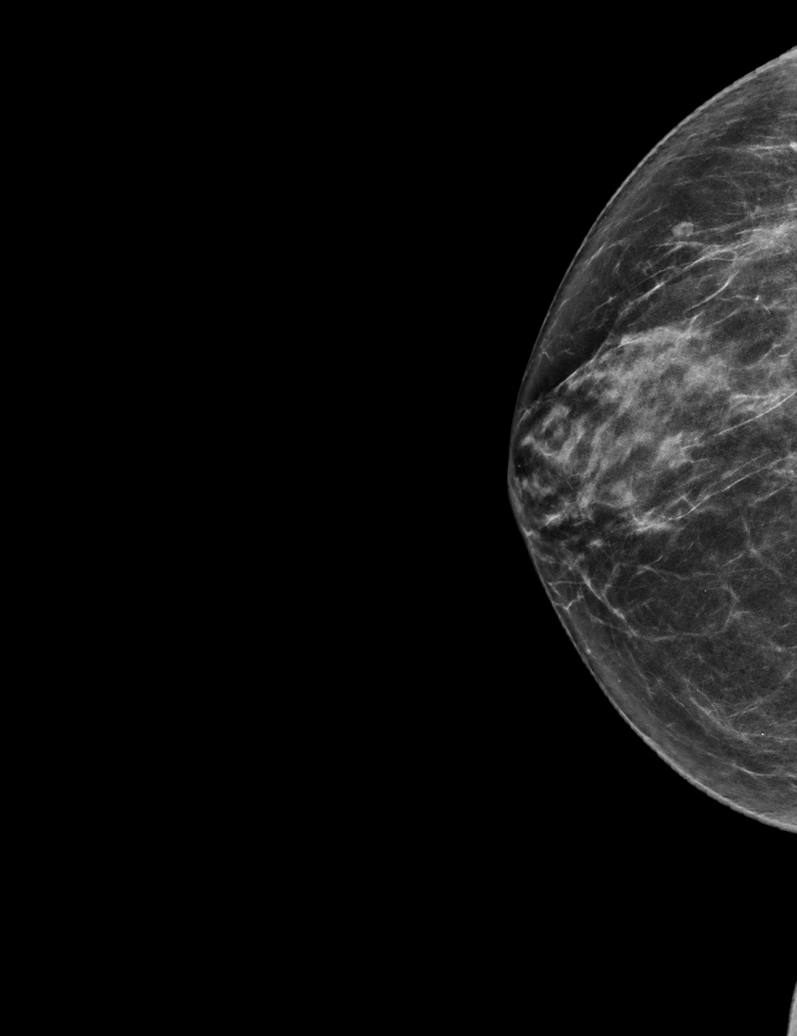

[L CC synth-2D]
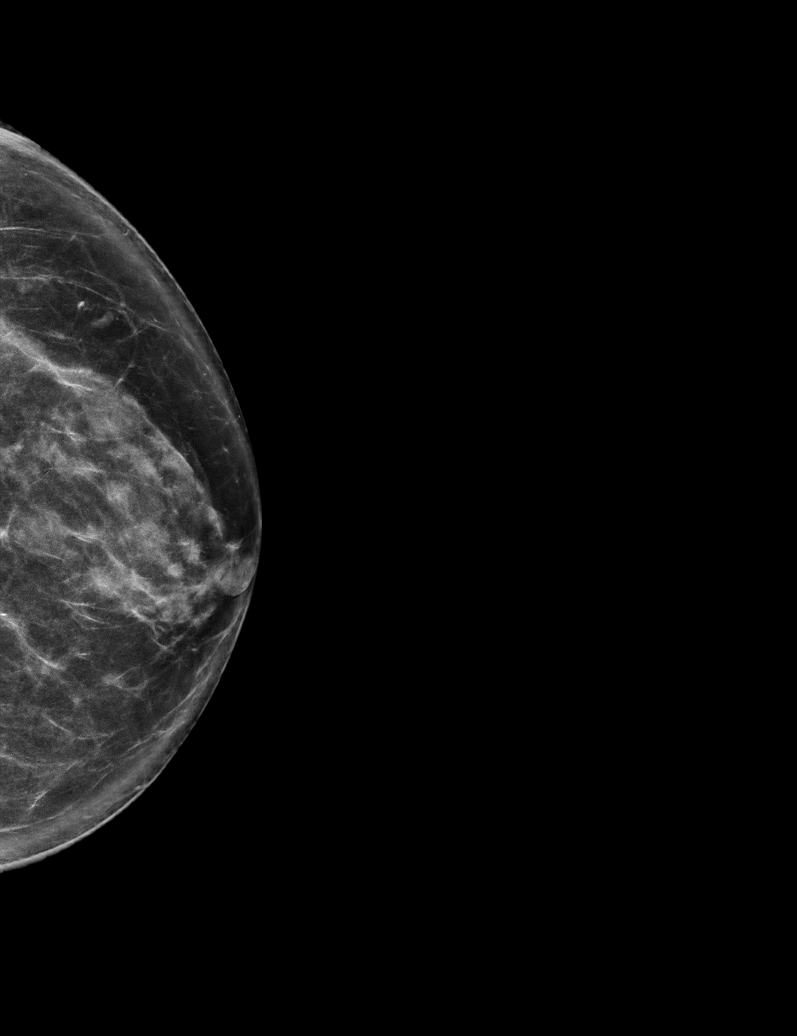

[R MLO synth-2D]
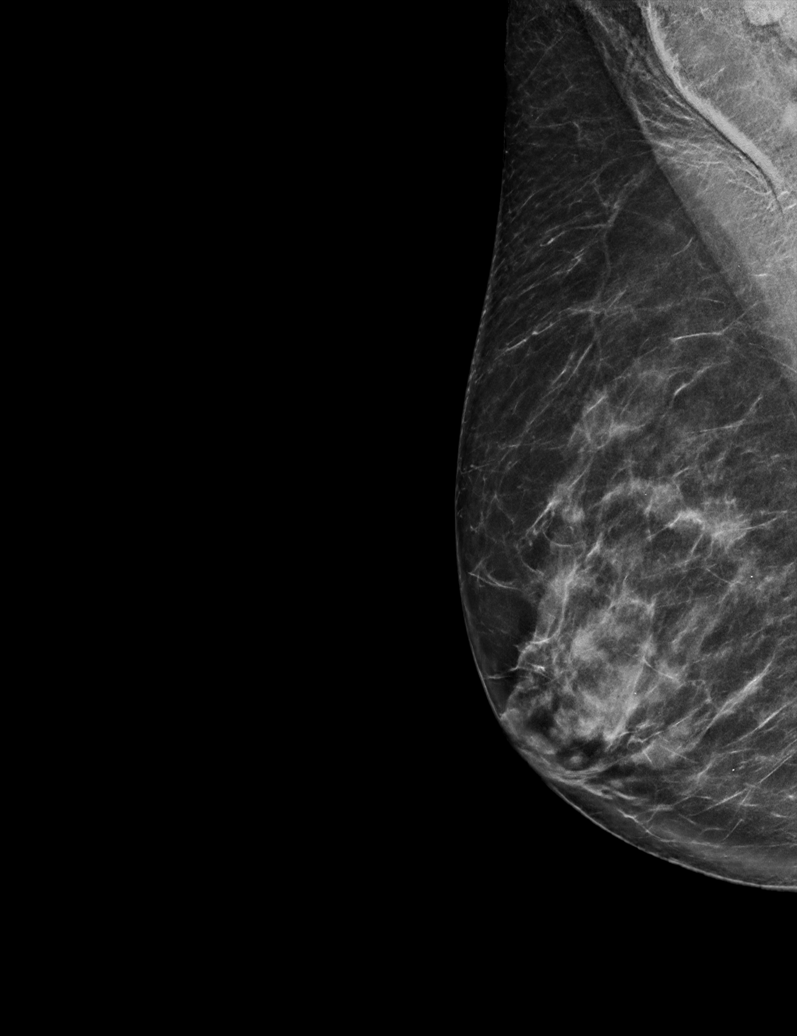

[L MLO tomo · tomo slice 33/65.0]
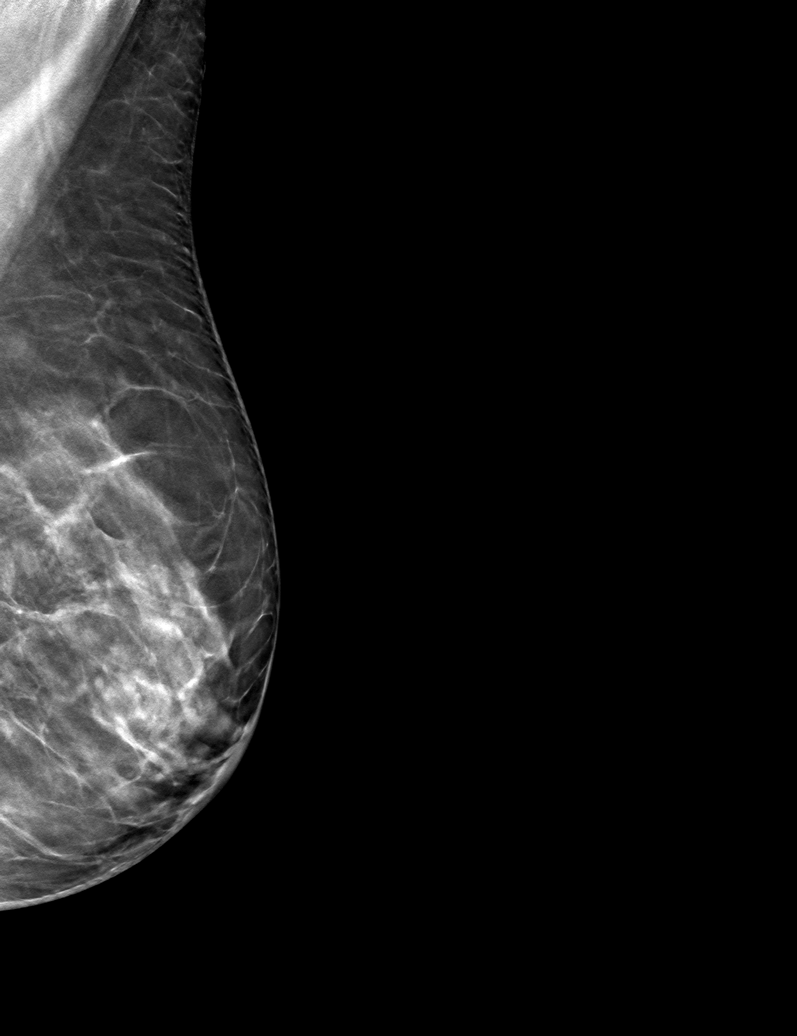

[6 of 30 positions shown; findings below may reference images not displayed]

ACR Breast Density Category c: The breast tissue is heterogeneously
dense, which may obscure small masses.
FINDINGS: There are no findings suspicious for malignancy.
IMPRESSION: No mammographic evidence of malignancy. A result letter of this
screening mammogram will be mailed directly to the patient.

RECOMMENDATION:
Screening mammogram in one year. (Code:Q3-W-BC3)

BI-RADS CATEGORY  1: Negative.

## 2022-10-06 ENCOUNTER — Other Ambulatory Visit: Payer: Self-pay

## 2022-10-06 DIAGNOSIS — E782 Mixed hyperlipidemia: Secondary | ICD-10-CM

## 2022-10-06 MED ORDER — ATORVASTATIN CALCIUM 80 MG PO TABS: ORAL_TABLET | ORAL | 0 refills | Status: DC

## 2022-10-11 ENCOUNTER — Encounter: Payer: Commercial Managed Care - HMO | Admitting: Family Medicine

## 2022-10-20 ENCOUNTER — Encounter: Payer: Self-pay | Admitting: Family Medicine

## 2022-10-20 ENCOUNTER — Ambulatory Visit (INDEPENDENT_AMBULATORY_CARE_PROVIDER_SITE_OTHER): Payer: Commercial Managed Care - HMO | Admitting: Family Medicine

## 2022-10-20 VITALS — BP 124/70 | HR 78 | Temp 97.1°F | Ht 65.0 in | Wt 118.0 lb

## 2022-10-20 DIAGNOSIS — I10 Essential (primary) hypertension: Secondary | ICD-10-CM

## 2022-10-20 DIAGNOSIS — Z23 Encounter for immunization: Secondary | ICD-10-CM | POA: Diagnosis not present

## 2022-10-20 DIAGNOSIS — F17200 Nicotine dependence, unspecified, uncomplicated: Secondary | ICD-10-CM | POA: Diagnosis not present

## 2022-10-20 DIAGNOSIS — E782 Mixed hyperlipidemia: Secondary | ICD-10-CM | POA: Diagnosis not present

## 2022-10-20 DIAGNOSIS — Z0000 Encounter for general adult medical examination without abnormal findings: Secondary | ICD-10-CM

## 2022-10-20 NOTE — Progress Notes (Signed)
Subjective:  Patient ID: Michelle Montes, female    DOB: [birth year]  Age: 62 y.o. MRN: [id number]  Chief Complaint  Patient presents with   Medical Management of Chronic Issues    HPI Hyperlipidemia: Atorvastatin 80 mg daily.      Hypertension:  Lisinopril 20 mg daily, aspirin 81 mg daily.  Patient has history of 2 strokes.  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 She follows with Dr. Patric Dykes.    Chronic smoker: Patient has no desire to quit. Decreased 1  1/2 --->1 ppd.  Well Adult Physical: Patient here for a comprehensive physical exam.The patient reports no problems Do you take any herbs or supplements that were not prescribed by a doctor? no Are you taking calcium supplements? no Are you taking aspirin daily? yes  Encounter for general adult medical examination without abnormal findings  Physical ("At Risk" items are starred): Patient's last physical exam was 1 year ago .  Patient is not afflicted from Stress Incontinence and Urge Incontinence  Patient wears a seat belts Patient has smoke detectors and has carbon monoxide detectors. Patient practices appropriate gun safety. Patient wears sunscreen with extended sun exposure. Dental Care: biannual cleanings, brushes and flosses sometimes Ophthalmology/Optometry: Needs appt. Hearing loss: none Vision impairments: Glasses  Menarche: 62 y.o Menstrual History: Reg LMP: S/P Menopause early 58s Pregnancy history: G0P0 Safe at home: yes Self breast exams: yes  Declined colonscopy or cologuard, DEXA     10/20/2022    9:05 AM 04/07/2022    7:37 AM 02/16/2021    8:56 AM  Depression screen PHQ 2/9  Decreased Interest 0 0 3  Down, Depressed, Hopeless 0 0 0  PHQ - 2 Score 0 0 3  Altered sleeping 2    Tired, decreased energy 0    Change in appetite 0    Feeling bad or failure about yourself  0    Trouble concentrating 0    Moving slowly or fidgety/restless 0     Suicidal thoughts 0    PHQ-9 Score 2    Difficult doing work/chores Not difficult at all           02/16/2021    9:11 AM 04/07/2022    7:37 AM 10/20/2022    9:05 AM  Fall Risk  Falls in the past year? 0 0 1  Was there an injury with Fall? 0 0 0  Fall Risk Category Calculator 0 0 1  Fall Risk Category (Retired) Low Low   (RETIRED) Patient Fall Risk Level Low fall risk Low fall risk   Patient at Risk for Falls Due to  No Fall Risks No Fall Risks  Fall risk Follow up Falls evaluation completed Falls evaluation completed Falls evaluation completed    Social Hx   Social History   Socioeconomic History   Marital status: Significant Other    Spouse name: Not on file   Number of children: 0   Years of education: Not on file   Highest education level: Not on file  Occupational History   Not on file  Tobacco Use   Smoking status: Every Day    Current packs/day: 1.50    Average packs/day: 1.5 packs/day for 42.0 years (63.0 ttl pk-yrs)    Types: Cigarettes   Smokeless tobacco: Never  Vaping Use   Vaping status: Never Used  Substance and Sexual Activity   Alcohol use: Yes    Alcohol/week: 2.0 standard drinks of alcohol  Types: 2 Cans of beer per week    Comment: socially   Drug use: Never   Sexual activity: Yes    Partners: Male  Other Topics Concern   Not on file  Social History Narrative   Not on file   Social Determinants of Health   Financial Resource Strain: Low Risk  (10/20/2022)   Overall Financial Resource Strain (CARDIA)    Difficulty of Paying Living Expenses: Not hard at all  Food Insecurity: No Food Insecurity (10/20/2022)   Hunger Vital Sign    Worried About Running Out of Food in the Last Year: Never true    Ran Out of Food in the Last Year: Never true  Transportation Needs: No Transportation Needs (10/20/2022)   PRAPARE - Administrator, Civil Service (Medical): No    Lack of Transportation (Non-Medical): No  Physical Activity: Inactive  (10/20/2022)   Exercise Vital Sign    Days of Exercise per Week: 0 days    Minutes of Exercise per Session: 0 min  Stress: No Stress Concern Present (10/20/2022)   [name] of Occupational Health - Occupational Stress Questionnaire    Feeling of Stress : Not at all  Social Connections: Socially Isolated (10/20/2022)   Social Connection and Isolation Panel [NHANES]    Frequency of Communication with Friends and Family: More than three times a week    Frequency of Social Gatherings with Friends and Family: More than three times a week    Attends Religious Services: Never    Database administrator or Organizations: No    Attends Engineer, structural: Never    Marital Status: Divorced   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   Past Surgical History:  Procedure Laterality Date   CHOLECYSTECTOMY     2004   IR ANGIO INTRA EXTRACRAN SEL INTERNAL CAROTID BILAT MOD SED  10/14/2017   IR ANGIO INTRA EXTRACRAN SEL INTERNAL CAROTID BILAT MOD SED  03/06/2019   IR ANGIO INTRA EXTRACRAN SEL INTERNAL CAROTID UNI L MOD SED  12/09/2017   IR ANGIO INTRA EXTRACRAN SEL INTERNAL CAROTID UNI L MOD SED  06/02/2018   IR ANGIO VERTEBRAL SEL VERTEBRAL BILAT MOD SED  10/14/2017   IR ANGIO VERTEBRAL SEL VERTEBRAL UNI R MOD SED  03/06/2019   IR ANGIOGRAM FOLLOW UP STUDY  12/09/2017   IR TRANSCATH/EMBOLIZ  12/09/2017   IR US GUIDE VASC ACCESS RIGHT  03/06/2019   RADIOLOGY WITH ANESTHESIA N/A 12/09/2017   Procedure: Embolization of aneurysm;  Surgeon: Lisbeth Renshaw, MD;  Location: [facility] OR;  Service: Radiology;  Laterality: N/A;   TUBAL LIGATION     2005   WRIST SURGERY Left    2017- screws and  plates    Family History  Problem Relation Age of Onset   Hypertension Mother    Heart attack Mother    Hypertension Father    Heart attack Father    Heart attack Brother    Hypertension Brother    Hyperlipidemia Brother    Heart failure Brother    Breast cancer Neg Hx     Review of Systems  Constitutional:  Negative for chills, fatigue  and fever.  HENT:  Negative for congestion, ear pain, rhinorrhea and sore throat.   Respiratory:  Negative for cough and shortness of breath.   Cardiovascular:  Negative for chest pain.  Gastrointestinal:  Negative for abdominal pain, constipation, diarrhea, nausea and vomiting.  Genitourinary:  Negative for dysuria and urgency.  Musculoskeletal:  Negative for back pain and myalgias.  Neurological:  Negative for dizziness, weakness, light-headedness and headaches.  Psychiatric/Behavioral:  Negative for dysphoric mood. The patient is not nervous/anxious.      Objective:  BP 124/70   Pulse 78   Temp (!) 97.1 F (36.2 C)   Ht 5\' 5"  (1.651 m)   Wt 118 lb (53.5 kg)   SpO2 98%   BMI 19.64 kg/m      10/20/2022    8:20 AM 04/07/2022    7:36 AM 09/30/2021    8:42 AM  BP/Weight  Systolic BP 124 120 110  Diastolic BP 70 86 80  Wt. (Lbs) 118 120.8 120  BMI 19.64 kg/m2 20.1 kg/m2 19.37 kg/m2    Physical Exam Vitals reviewed.  Constitutional:      Appearance: Normal appearance. She is normal weight.  HENT:     Right Ear: Tympanic membrane normal.     Left Ear: Tympanic membrane normal.     Nose: Nose normal.     Mouth/Throat:     Pharynx: No oropharyngeal exudate or posterior oropharyngeal erythema.  Eyes:     Conjunctiva/sclera: Conjunctivae normal.  Neck:     Vascular: No carotid bruit.  Cardiovascular:     Rate and Rhythm: Normal rate and regular rhythm.     Pulses: Normal pulses.     Heart sounds: Normal heart sounds.  Pulmonary:     Effort: Pulmonary effort is normal. No respiratory distress.     Breath sounds: Normal breath  sounds.  Abdominal:     General: Abdomen is flat. Bowel sounds are normal.     Palpations: Abdomen is soft. There is no mass.     Tenderness: There is no abdominal tenderness.  Lymphadenopathy:     Cervical: No cervical adenopathy.  Skin:    Findings: No lesion.  Neurological:     Mental Status: She is alert and oriented to person, place, and time.  Psychiatric:        Mood and Affect: Mood normal.        Behavior: Behavior normal.     Lab Results  Component Value Date   WBC 8.2 10/20/2022   HGB 15.7 10/20/2022   HCT 47.7 (H) 10/20/2022   PLT 236 10/20/2022   GLUCOSE 90 10/20/2022   CHOL 145 10/20/2022   TRIG 71 10/20/2022   HDL 60 10/20/2022   LDLCALC 71 10/20/2022   ALT 19 10/20/2022   AST 19 10/20/2022   NA 142 10/20/2022   K 5.6 (H) 10/20/2022   CL 106 10/20/2022   CREATININE 0.59 10/20/2022   BUN 11 10/20/2022   CO2 25 10/20/2022   TSH 0.718 10/20/2022   INR 0.9 03/06/2019      Assessment & Plan:  Routine medical exam Assessment & Plan: Education given.   Essential (primary) hypertension Assessment & Plan: Well controlled.  Lisinopril 20 mg daily, Aspirin 81 mg daily Continue to work on eating a healthy diet and exercise.  Labs drawn today.    Orders: -     CBC with Differential/Platelet -     Comprehensive metabolic panel  Mixed hyperlipidemia Assessment & Plan: Well controlled.  No  changes to medicines. Continue atorvastatin 80 mg before bed.  Continue to work on eating a healthy diet and exercise.  Labs drawn today.    Orders: -     Lipid panel -     TSH  Nicotine dependence with current use Assessment & Plan: Refuses cessation.  Encouraged to try decrease to 1 ppd before next appt.    Immunization due -     Varicella-zoster vaccine IM -     Tdap vaccine greater than or equal to 7yo IM     Body mass index is 19.64 kg/m.   This is a list of the screening recommended for you and due dates:  Health Maintenance  Topic Date  Due   Colon Cancer Screening  Never done   Screening for Lung Cancer  03/14/2023*   Flu Shot  11/11/2022   Zoster (Shingles) Vaccine (2 of 2) 12/15/2022   Pap Smear  03/03/2024   Mammogram  04/21/2024   DTaP/Tdap/Td vaccine (3 - Td or Tdap) 10/19/2032   COVID-19 Vaccine  Completed   Hepatitis C Screening  Completed   HIV Screening  Completed   HPV Vaccine  Aged Out  *Topic was postponed. The date shown is not the original due date.     No orders of the defined types were placed in this encounter.   Follow-up: Return in about 6 months (around 04/22/2023) for chronic fasting.  An After Visit Summary was printed and given to the patient.  Blane Ohara, MD [facility] [phone]

## 2022-10-20 NOTE — Progress Notes (Deleted)
Subjective:  Patient ID: Michelle Montes, female    DOB: 12-22-1960  Age: 62 y.o. MRN: [id number]  Chief Complaint  Patient presents with   Medical Management of Chronic Issues    HPI   Hyperlipidemia: Atorvastatin 80 mg daily.     Hypertension:  Lisinopril 20 mg daily, aspirin 81 mg daily.  Patient has history of 2 strokes.  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 She follows with Dr. Patric Dykes.   Chronic smoker: Patient has no desire to quit.      04/07/2022    7:37 AM 02/16/2021    8:56 AM  Depression screen PHQ 2/9  Decreased Interest 0 3  Down, Depressed, Hopeless 0 0  PHQ - 2 Score 0 3        04/07/2022    7:37 AM  Fall Risk   Falls in the past year? 0  Number falls in past yr: 0  Injury with Fall? 0  Risk for fall due to : No Fall Risks  Follow up Falls evaluation completed    Patient Care Team: Blane Ohara, MD as PCP - General (Family Medicine)   Review of Systems  Current Outpatient Medications on File Prior to Visit  Medication Sig Dispense Refill   aspirin EC 81 MG tablet Take 81 mg by mouth daily. Swallow whole.     atorvastatin (LIPITOR) 80 MG tablet TAKE 1 TABLET(80 MG) BY MOUTH EVERY DAY 90 tablet 0   lisinopril (ZESTRIL) 20 MG tablet TAKE 1 TABLET(20 MG) BY MOUTH EVERY DAY 90 tablet 2   omeprazole (PRILOSEC OTC) 20 MG tablet Take 1 tablet (20 mg total) by mouth daily as needed (heartburn/indigestion). 90 tablet 2   No current facility-administered medications on file prior to visit.   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   Past Surgical History:  Procedure Laterality Date    CHOLECYSTECTOMY     2004   IR ANGIO INTRA EXTRACRAN SEL INTERNAL CAROTID BILAT MOD SED  10/14/2017   IR ANGIO INTRA EXTRACRAN SEL INTERNAL CAROTID BILAT MOD SED  03/06/2019   IR ANGIO INTRA EXTRACRAN SEL INTERNAL CAROTID UNI L MOD SED  12/09/2017   IR ANGIO INTRA EXTRACRAN SEL INTERNAL CAROTID UNI L MOD SED  06/02/2018   IR ANGIO VERTEBRAL SEL VERTEBRAL BILAT MOD SED  10/14/2017   IR ANGIO VERTEBRAL SEL VERTEBRAL UNI R MOD SED  03/06/2019   IR ANGIOGRAM FOLLOW UP STUDY  12/09/2017   IR TRANSCATH/EMBOLIZ  12/09/2017   IR US GUIDE VASC ACCESS RIGHT  03/06/2019   RADIOLOGY WITH ANESTHESIA N/A 12/09/2017   Procedure: Embolization of aneurysm;  Surgeon: Lisbeth Renshaw, MD;  Location: [facility] OR;  Service: Radiology;  Laterality: N/A;   TUBAL LIGATION     2005   WRIST SURGERY Left    2017- screws and plates    Family History  Problem Relation Age of Onset   Hypertension Mother    Heart attack Mother    Hypertension Father    Heart attack Father    Heart attack Brother    Hypertension Brother    Hyperlipidemia Brother  Heart failure Brother    Breast cancer Neg Hx    Social History   Socioeconomic History   Marital status: Married    Spouse name: Not on file   Number of children: 0   Years of education: Not on file   Highest education level: Not on file  Occupational History   Not on file  Tobacco Use   Smoking status: Every Day    Packs/day: 1.50    Years: 42.00    Additional pack years: 0.00    Total pack years: 63.00    Types: Cigarettes   Smokeless tobacco: Never  Vaping Use   Vaping Use: Never used  Substance and Sexual Activity   Alcohol use: Yes    Alcohol/week: 2.0 standard drinks of alcohol    Types: 2 Cans of beer per week    Comment: socially   Drug use: Never   Sexual activity: Yes    Partners: Male  Other Topics Concern   Not on file  Social History Narrative   Not on file   Social Determinants of Health   Financial Resource Strain: Not on file  Food  Insecurity: Not on file  Transportation Needs: Not on file  Physical Activity: Not on file  Stress: Not on file  Social Connections: Not on file    Objective:  There were no vitals taken for this visit.     04/07/2022    7:36 AM 09/30/2021    8:42 AM 03/03/2021    8:34 AM  BP/Weight  Systolic BP 120 110 140  Diastolic BP 86 80 86  Wt. (Lbs) 120.8 120 122  BMI 20.1 kg/m2 19.37 kg/m2 19.69 kg/m2    Physical Exam  Diabetic Foot Exam - Simple   No data filed      Lab Results  Component Value Date   WBC 5.7 04/07/2022   HGB 13.3 04/07/2022   HCT 40.3 04/07/2022   PLT 312 04/07/2022   GLUCOSE 88 04/07/2022   CHOL 148 04/07/2022   TRIG 53 04/07/2022   HDL 70 04/07/2022   LDLCALC 67 04/07/2022   ALT 23 04/07/2022   AST 17 04/07/2022   NA 143 04/07/2022   K 4.4 04/07/2022   CL 104 04/07/2022   CREATININE 0.60 04/07/2022   BUN 13 04/07/2022   CO2 21 04/07/2022   TSH 0.723 04/07/2022   INR 0.9 03/06/2019      Assessment & Plan:    Essential (primary) hypertension Assessment & Plan: Well controlled.  Lisinopril 20 mg daily, Aspirin 81 mg daily Continue to work on eating a healthy diet and exercise.  Labs drawn today.     Mixed hyperlipidemia Assessment & Plan: Well controlled.  No changes to medicines. Continue atorvastatin 80 mg before bed.  Continue to work on eating a healthy diet and exercise.  Labs drawn today.     Nicotine dependence with current use Assessment & Plan: Refuses cessation.  Encouraged to try decrease to 1 ppd before next appt.       No orders of the defined types were placed in this encounter.   No orders of the defined types were placed in this encounter.    Follow-up: No follow-ups on file.   I,Marla I Leal-Borjas,acting as a scribe for Blane Ohara, MD.,have documented all relevant documentation on the behalf of Blane Ohara, MD,as directed by  Blane Ohara, MD while in the presence of Blane Ohara, MD.   An After  Visit Summary was printed and given  to the patient.  Blane Ohara, MD [facility] [phone]

## 2022-10-20 NOTE — Assessment & Plan Note (Signed)
Well controlled.  No changes to medicines. Continue atorvastatin 80 mg before bed.  Continue to work on eating a healthy diet and exercise.  Labs drawn today.   

## 2022-10-20 NOTE — Assessment & Plan Note (Signed)
Refuses cessation.  Encouraged to try decrease to 1 ppd before next appt.  

## 2022-10-20 NOTE — Assessment & Plan Note (Signed)
Well controlled.  Lisinopril 20 mg daily, Aspirin 81 mg daily Continue to work on eating a healthy diet and exercise.  Labs drawn today.   

## 2022-10-21 ENCOUNTER — Other Ambulatory Visit: Payer: Self-pay | Admitting: Family Medicine

## 2022-10-21 ENCOUNTER — Other Ambulatory Visit: Payer: Self-pay

## 2022-10-21 LAB — LIPID PANEL
Chol/HDL Ratio: 2.4 ratio (ref 0.0–4.4)
Cholesterol, Total: 145 mg/dL (ref 100–199)
HDL: 60 mg/dL (ref >39)
LDL Chol Calc (NIH): 71 mg/dL (ref 0–99)
Triglycerides: 71 mg/dL (ref 0–149)
VLDL Cholesterol Cal: 14 mg/dL (ref 5–40)

## 2022-10-21 LAB — CBC WITH DIFFERENTIAL/PLATELET
Basophils Absolute: 0 10*3/uL (ref 0.0–0.2)
Basos: 0 %
EOS (ABSOLUTE): 0.2 10*3/uL (ref 0.0–0.4)
Eos: 2 %
Hematocrit: 47.7 % — ABNORMAL HIGH (ref 34.0–46.6)
Hemoglobin: 15.7 g/dL (ref 11.1–15.9)
Immature Grans (Abs): 0 10*3/uL (ref 0.0–0.1)
Immature Granulocytes: 0 %
Lymphocytes Absolute: 2.5 10*3/uL (ref 0.7–3.1)
Lymphs: 30 %
MCH: 28.4 pg (ref 26.6–33.0)
MCHC: 32.9 g/dL (ref 31.5–35.7)
MCV: 86 fL (ref 79–97)
Monocytes Absolute: 0.6 10*3/uL (ref 0.1–0.9)
Monocytes: 7 %
Neutrophils Absolute: 5 10*3/uL (ref 1.4–7.0)
Neutrophils: 61 %
Platelets: 236 10*3/uL (ref 150–450)
RBC: 5.53 x10E6/uL — ABNORMAL HIGH (ref 3.77–5.28)
RDW: 13.2 % (ref 11.7–15.4)
WBC: 8.2 10*3/uL (ref 3.4–10.8)

## 2022-10-21 LAB — COMPREHENSIVE METABOLIC PANEL
ALT: 19 IU/L (ref 0–32)
AST: 19 IU/L (ref 0–40)
Albumin: 4.8 g/dL (ref 3.9–4.9)
Alkaline Phosphatase: 139 IU/L — ABNORMAL HIGH (ref 44–121)
BUN/Creatinine Ratio: 19 (ref 12–28)
BUN: 11 mg/dL (ref 8–27)
Bilirubin Total: 0.7 mg/dL (ref 0.0–1.2)
CO2: 25 mmol/L (ref 20–29)
Calcium: 10 mg/dL (ref 8.7–10.3)
Chloride: 106 mmol/L (ref 96–106)
Creatinine, Ser: 0.59 mg/dL (ref 0.57–1.00)
Globulin, Total: 2.1 g/dL (ref 1.5–4.5)
Glucose: 90 mg/dL (ref 70–99)
Potassium: 5.6 mmol/L — ABNORMAL HIGH (ref 3.5–5.2)
Sodium: 142 mmol/L (ref 134–144)
Total Protein: 6.9 g/dL (ref 6.0–8.5)
eGFR: 101 mL/min/{1.73_m2} (ref >59)

## 2022-10-21 LAB — TSH: TSH: 0.718 u[IU]/mL (ref 0.450–4.500)

## 2022-10-21 MED ORDER — LOSARTAN POTASSIUM 50 MG PO TABS: 50.0000 mg | ORAL_TABLET | Freq: Every day | ORAL | 1 refills | Status: DC

## 2022-10-23 DIAGNOSIS — Z23 Encounter for immunization: Secondary | ICD-10-CM | POA: Insufficient documentation

## 2022-10-23 DIAGNOSIS — Z0000 Encounter for general adult medical examination without abnormal findings: Secondary | ICD-10-CM | POA: Insufficient documentation

## 2022-10-23 NOTE — Assessment & Plan Note (Signed)
Education given.  ?

## 2022-10-30 ENCOUNTER — Other Ambulatory Visit: Payer: Self-pay

## 2022-10-30 DIAGNOSIS — R799 Abnormal finding of blood chemistry, unspecified: Secondary | ICD-10-CM

## 2022-11-05 ENCOUNTER — Other Ambulatory Visit: Payer: Commercial Managed Care - HMO

## 2022-11-05 DIAGNOSIS — R799 Abnormal finding of blood chemistry, unspecified: Secondary | ICD-10-CM

## 2022-11-05 LAB — COMPREHENSIVE METABOLIC PANEL
ALT: 19 IU/L (ref 0–32)
AST: 19 IU/L (ref 0–40)
Albumin: 4.6 g/dL (ref 3.9–4.9)
Alkaline Phosphatase: 139 IU/L — ABNORMAL HIGH (ref 44–121)
BUN/Creatinine Ratio: 16 (ref 12–28)
BUN: 10 mg/dL (ref 8–27)
Bilirubin Total: 0.4 mg/dL (ref 0.0–1.2)
CO2: 26 mmol/L (ref 20–29)
Calcium: 9.8 mg/dL (ref 8.7–10.3)
Chloride: 103 mmol/L (ref 96–106)
Creatinine, Ser: 0.62 mg/dL (ref 0.57–1.00)
Globulin, Total: 2.1 g/dL (ref 1.5–4.5)
Glucose: 103 mg/dL — ABNORMAL HIGH (ref 70–99)
Potassium: 5 mmol/L (ref 3.5–5.2)
Sodium: 142 mmol/L (ref 134–144)
Total Protein: 6.7 g/dL (ref 6.0–8.5)
eGFR: 99 mL/min/{1.73_m2} (ref >59)

## 2022-11-05 NOTE — Progress Notes (Signed)
Patient is in office today for a nurse visit for Blood Pressure Check. Patient blood pressure was 164/90, pulse 80, Patient No chest pain, No shortness of breath, No dyspnea on exertion, No edema, No palpitations, No syncope. Dr Sedalia Muta recommended to increase losartan 50 mg to 100 mg daily, take blood pressure at home and keep a log until next appointment in 3-4 weeks. Call us back before, if she has chest pain, SOB or other symptoms. Patient verbalized to understand.

## 2022-11-08 ENCOUNTER — Other Ambulatory Visit: Payer: Self-pay

## 2022-11-08 ENCOUNTER — Other Ambulatory Visit: Payer: Self-pay | Admitting: Family Medicine

## 2022-11-08 MED ORDER — LOSARTAN POTASSIUM 50 MG PO TABS: 100.0000 mg | ORAL_TABLET | Freq: Every day | ORAL | 1 refills | Status: DC

## 2022-11-24 NOTE — Progress Notes (Unsigned)
Subjective:  Patient ID: Michelle Montes, female    DOB: 01-Jun-1960  Age: 62 y.o. MRN: [id number]  Chief Complaint  Patient presents with   Medical Management of Chronic Issues    HPI   Hypertension:  Patient was taking Lisinopril 20 mg daily, aspirin 81 mg daily.  On 10/20/2022 her potassium was high 5.6. She came back to recheck and it was normal on 11/05/22 (5.0).Dr Sedalia Muta changed lisinopril to losartan. She came back and blood pressure was high and Dr Sedalia Muta increase Losartan 50 mg to 100 mg on 11/06/22 Her blood pressure at home systolic 106-131 and diastolic 53-84.  Patient BP today in office is higher however she is asymptomatic. Patient did state that she had taken her BP medicine this morning. Discussed with her to monitor symptoms and let us know if she starts to experience any HA, vision changes, Dizziness, fatigue, chest pain. Patient agreed and will call us next week to update [place] on her BP.          10/20/2022    9:05 AM 04/07/2022    7:37 AM 02/16/2021    8:56 AM  Depression screen PHQ 2/9  Decreased Interest 0 0 3  Down, Depressed, Hopeless 0 0 0  PHQ - 2 Score 0 0 3  Altered sleeping 2    Tired, decreased energy 0    Change in appetite 0    Feeling bad or failure about yourself  0    Trouble concentrating 0    Moving slowly or fidgety/restless 0    Suicidal thoughts 0    PHQ-9 Score 2    Difficult doing work/chores Not difficult at all          10/20/2022    9:05 AM  Fall Risk   Falls in the past year? 1  Number falls in past yr: 0  Injury with Fall? 0  Risk for fall due to : No Fall Risks  Follow up Falls evaluation completed    Patient Care Team: Blane Ohara, MD as PCP - General (Family Medicine)   Review of Systems  Constitutional:  Negative for chills, fatigue and fever.  HENT:  Negative for congestion, ear pain and sore throat.   Respiratory:  Negative for cough and shortness of breath.   Cardiovascular:  Negative for chest pain and palpitations.   Gastrointestinal:  Negative for abdominal pain, constipation, diarrhea, nausea and vomiting.  Endocrine: Negative for polydipsia, polyphagia and polyuria.  Genitourinary:  Negative for difficulty urinating and dysuria.  Musculoskeletal:  Negative for arthralgias, back pain and myalgias.  Skin:  Negative for rash.  Neurological:  Negative for headaches.  Psychiatric/Behavioral:  Negative for dysphoric mood. The patient is not nervous/anxious.     Current Outpatient Medications on File Prior to Visit  Medication Sig Dispense Refill   aspirin EC 81 MG tablet Take 81 mg by mouth daily. Swallow whole.     atorvastatin (LIPITOR) 80 MG tablet TAKE 1 TABLET(80 MG) BY MOUTH EVERY DAY 90 tablet 0   losartan (COZAAR) 50 MG tablet Take 2 tablets (100 mg total) by mouth daily. 60 tablet 1   omeprazole (PRILOSEC OTC) 20 MG tablet Take 1 tablet (20 mg total) by mouth daily as needed (heartburn/indigestion). 90 tablet 2   No current facility-administered medications on file prior to visit.   Past Medical History:  Diagnosis Date   Essential (primary) hypertension 02/16/2021   Hypertension    Mixed hyperlipidemia 02/16/2021   Nicotine dependence with current  use 02/16/2021   Stroke (HCC) 2017   left arm numbness and speech slurred   Stroke (HCC) 2019   Left leg numbness   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/18/2013   Past Surgical History:  Procedure Laterality Date   CHOLECYSTECTOMY     2004   IR ANGIO INTRA EXTRACRAN SEL INTERNAL CAROTID BILAT MOD SED  10/14/2017   IR ANGIO INTRA EXTRACRAN SEL INTERNAL CAROTID BILAT MOD SED  03/06/2019   IR ANGIO INTRA EXTRACRAN SEL INTERNAL CAROTID UNI L MOD SED  12/09/2017   IR ANGIO INTRA EXTRACRAN SEL INTERNAL CAROTID UNI L MOD SED  06/02/2018   IR ANGIO VERTEBRAL SEL VERTEBRAL BILAT MOD SED  10/14/2017   IR ANGIO VERTEBRAL SEL VERTEBRAL UNI R MOD SED  03/06/2019   IR ANGIOGRAM FOLLOW UP STUDY  12/09/2017   IR TRANSCATH/EMBOLIZ  12/09/2017    IR US GUIDE VASC ACCESS RIGHT  03/06/2019   RADIOLOGY WITH ANESTHESIA N/A 12/09/2017   Procedure: Embolization of aneurysm;  Surgeon: Lisbeth Renshaw, MD;  Location: [facility] OR;  Service: Radiology;  Laterality: N/A;   TUBAL LIGATION     2005   WRIST SURGERY Left    2017- screws and plates    Family History  Problem Relation Age of Onset   Hypertension Mother    Heart attack Mother    Hypertension Father    Heart attack Father    Heart attack Brother    Hypertension Brother    Hyperlipidemia Brother    Heart failure Brother    Breast cancer Neg Hx    Social History   Socioeconomic History   Marital status: Significant Other    Spouse name: Not on file   Number of children: 0   Years of education: Not on file   Highest education level: Not on file  Occupational History   Not on file  Tobacco Use   Smoking status: Every Day    Current packs/day: 1.50    Average packs/day: 1.5 packs/day for 42.0 years (63.0 ttl pk-yrs)    Types: Cigarettes   Smokeless tobacco: Never  Vaping Use   Vaping status: Never Used  Substance and Sexual Activity   Alcohol use: Yes    Alcohol/week: 2.0 standard drinks of alcohol    Types: 2 Cans of beer per week    Comment: socially   Drug use: Never   Sexual activity: Yes    Partners: Male  Other Topics Concern   Not on file  Social History Narrative   Not on file   Social Determinants of Health   Financial Resource Strain: Low Risk  (10/20/2022)   Overall Financial Resource Strain (CARDIA)    Difficulty of Paying Living Expenses: Not hard at all  Food Insecurity: No Food Insecurity (10/20/2022)   Hunger Vital Sign    Worried About Running Out of Food in the Last Year: Never true    Ran Out of Food in the Last Year: Never true  Transportation Needs: No Transportation Needs (10/20/2022)   PRAPARE - Administrator, Civil Service (Medical): No    Lack of Transportation (Non-Medical): No  Physical Activity: Inactive (10/20/2022)    Exercise Vital Sign    Days of Exercise per Week: 0 days    Minutes of Exercise per Session: 0 min  Stress: No Stress Concern Present (10/20/2022)   [name] of Occupational Health - Occupational Stress Questionnaire    Feeling of Stress : Not at all  Social Connections: Socially Isolated (10/20/2022)   Social Connection and Isolation Panel [NHANES]    Frequency of Communication with Friends and Family: More than three times a week    Frequency of Social Gatherings with Friends and Family: More than three times a week    Attends Religious Services: Never    Database administrator or Organizations: No    Attends Engineer, structural: Never    Marital Status: Divorced    Objective:  BP (!) 150/90   Pulse 84   Temp 97.9 F (36.6 C)   Resp 12   Ht 5\' 5"  (1.651 m)   Wt 119 lb (54 kg)   SpO2 99%   BMI 19.80 kg/m      11/25/2022    9:05 AM 11/25/2022    8:40 AM 11/05/2022   10:00 AM  BP/Weight  Systolic BP 150 140 164  Diastolic BP 90 90 90  Wt. (Lbs)  119   BMI  19.8 kg/m2     Physical Exam Vitals reviewed.  Constitutional:      Appearance: Normal appearance.  Cardiovascular:     Rate and Rhythm: Normal rate and regular rhythm.     Heart sounds: Normal heart sounds.  Pulmonary:     Effort: Pulmonary effort is normal.     Breath sounds: Normal breath sounds.  Abdominal:     General: Bowel sounds are normal.     Palpations: Abdomen is soft.     Tenderness: There is no abdominal tenderness.  Neurological:     Mental Status: She is alert and oriented to person, place, and time.  Psychiatric:        Mood and Affect: Mood normal.        Behavior: Behavior normal.     Diabetic Foot Exam - Simple   No data filed      Lab Results  Component Value Date   WBC 8.2 10/20/2022   HGB 15.7 10/20/2022   HCT 47.7 (H) 10/20/2022   PLT 236 10/20/2022   GLUCOSE 103 (H) 11/05/2022   CHOL 145 10/20/2022   TRIG 71 10/20/2022   HDL 60 10/20/2022   LDLCALC 71  10/20/2022   ALT 19 11/05/2022   AST 19 11/05/2022   NA 142 11/05/2022   K 5.0 11/05/2022   CL 103 11/05/2022   CREATININE 0.62 11/05/2022   BUN 10 11/05/2022   CO2 26 11/05/2022   TSH 0.718 10/20/2022   INR 0.9 03/06/2019      Assessment & Plan:    Essential (primary) hypertension Assessment & Plan: Patient will monitor her BP over the next week Decreased medicine to 75 mg (1.5 pills) daily. Will see if she has less dizzy spells If symptoms continue we will have her come in again Did seem slightly confused on which medicine was her BP medicine. Clarified which one she should take twice a day and patient voiced understanding. Will check to make sure she is taking right medicine if BP continues to be abnormal.    Mixed hyperlipidemia Assessment & Plan: Controlled Continue taking Lipitor 80mg  as directed Continue to monitor diet and exercise Lab Results  Component Value Date   LDLCALC 71 10/20/2022         No orders of the defined types were placed in this encounter.   No orders of the defined types were placed in this encounter.    Follow-up: Return if symptoms worsen or fail to improve.   I,Marla I Leal-Borjas,acting as  a scribe for Langley Gauss, PA.,have documented all relevant documentation on the behalf of Langley Gauss, PA,as directed by  Langley Gauss, PA while in the presence of Langley Gauss, [place].   An After Visit Summary was printed and given to the patient.  Langley Gauss, [place] [facility] [phone]

## 2022-11-25 ENCOUNTER — Encounter: Payer: Self-pay | Admitting: Physician Assistant

## 2022-11-25 ENCOUNTER — Ambulatory Visit: Payer: Commercial Managed Care - HMO | Admitting: Physician Assistant

## 2022-11-25 VITALS — BP 150/90 | HR 84 | Temp 97.9°F | Resp 12 | Ht 65.0 in | Wt 119.0 lb

## 2022-11-25 DIAGNOSIS — E782 Mixed hyperlipidemia: Secondary | ICD-10-CM

## 2022-11-25 DIAGNOSIS — I10 Essential (primary) hypertension: Secondary | ICD-10-CM | POA: Diagnosis not present

## 2022-11-25 NOTE — Assessment & Plan Note (Signed)
Patient will monitor her BP over the next week Decreased medicine to 75 mg (1.5 pills) daily. Will see if she has less dizzy spells If symptoms continue we will have her come in again Did seem slightly confused on which medicine was her BP medicine. Clarified which one she should take twice a day and patient voiced understanding. Will check to make sure she is taking right medicine if BP continues to be abnormal.

## 2022-11-25 NOTE — Assessment & Plan Note (Signed)
Controlled Continue taking Lipitor 80mg  as directed Continue to monitor diet and exercise Lab Results  Component Value Date   LDLCALC 71 10/20/2022

## 2022-12-23 ENCOUNTER — Ambulatory Visit (INDEPENDENT_AMBULATORY_CARE_PROVIDER_SITE_OTHER): Payer: Commercial Managed Care - HMO

## 2022-12-23 DIAGNOSIS — Z23 Encounter for immunization: Secondary | ICD-10-CM

## 2022-12-23 NOTE — Progress Notes (Signed)
Patient is in office today for a nurse visit for a Shingrix shot. Patient tolerated well.

## 2022-12-23 NOTE — Progress Notes (Deleted)
Patient is in office today for a nurse visit for Immunization. Patient Injection was given in the  Left deltoid. Patient tolerated injection well.

## 2022-12-28 ENCOUNTER — Telehealth: Payer: Self-pay

## 2022-12-28 ENCOUNTER — Other Ambulatory Visit: Payer: Self-pay

## 2022-12-28 MED ORDER — LOSARTAN POTASSIUM 50 MG PO TABS: 75.0000 mg | ORAL_TABLET | Freq: Every day | ORAL | Status: DC

## 2022-12-28 NOTE — Telephone Encounter (Signed)
Patient called and has questions if she was suppose to take her losartan 50 mg 2 tablets daily or 1 and half tablets.  Called patient after reading [employer], PA-C last office note he had her to decrease her losartan to 1 and 1/4 tablet daily. Left patient a detailed message and to call office with any questions.

## 2023-01-11 ENCOUNTER — Other Ambulatory Visit: Payer: Self-pay | Admitting: Family Medicine

## 2023-01-13 ENCOUNTER — Other Ambulatory Visit: Payer: Self-pay | Admitting: Family Medicine

## 2023-01-13 DIAGNOSIS — E782 Mixed hyperlipidemia: Secondary | ICD-10-CM

## 2023-01-24 ENCOUNTER — Other Ambulatory Visit: Payer: Self-pay

## 2023-01-24 MED ORDER — LOSARTAN POTASSIUM 50 MG PO TABS: 75.0000 mg | ORAL_TABLET | Freq: Every day | ORAL | 0 refills | Status: DC

## 2023-01-24 NOTE — Telephone Encounter (Signed)
Patient called and is requesting a 90 day supply of her BP medication to be sent to pharmacy.

## 2023-04-19 ENCOUNTER — Other Ambulatory Visit: Payer: Self-pay

## 2023-04-19 ENCOUNTER — Telehealth: Payer: Self-pay

## 2023-04-19 DIAGNOSIS — E782 Mixed hyperlipidemia: Secondary | ICD-10-CM

## 2023-04-19 MED ORDER — ATORVASTATIN CALCIUM 80 MG PO TABS: ORAL_TABLET | ORAL | 0 refills | Status: DC

## 2023-04-19 NOTE — Telephone Encounter (Signed)
 Prescription Request  04/19/2023   What is the name of the medication or equipment?  atorvastatin  (LIPITOR ) 80 MG tablet  Have you contacted your pharmacy to request a refill? No   [facility]  [address] [place], [place] [address]  Patient notified that their request is being sent to the clinical staff for review and that they should receive a response within 2 business days.   Please advise at [facility] [phone]

## 2023-04-20 ENCOUNTER — Other Ambulatory Visit: Payer: Self-pay

## 2023-04-20 DIAGNOSIS — E782 Mixed hyperlipidemia: Secondary | ICD-10-CM

## 2023-04-25 NOTE — Progress Notes (Signed)
 Subjective:  Patient ID: Michelle Montes, female    DOB: Apr 22, 1960  Age: 63 y.o. MRN: [id number]  Chief Complaint  Patient presents with   Medical Management of Chronic Issues    HPI Hyperlipidemia: Atorvastatin  80 mg daily.     Hypertension:  Losartan  50  1 1/2 daily, aspirin  81 mg daily.   Chronic smoker: Patient has no desire to quit.  1 PPD.   The patient was previously switched from lisinopril  to losartan  due to high potassium levels. She does not regularly check her blood pressure at home. The patient is a current smoker, consuming about a pack and a half of cigarettes daily. She has no desire to quit smoking and declines lung cancer screening. She received a flu vaccine at a local pharmacy but has not received a pneumonia vaccine or undergone colon cancer screening. She also reports needing to exercise more and wanting to lose about seven pounds.  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  She still has some mild left leg weakness.  Currently on aspirin  81 mg daily and Lipitor  80 mg daily.  Blood pressure is not currently controlled.  She does have an element of whitecoat hypertension.     04/26/2023    7:37 AM 10/20/2022    9:05 AM 04/07/2022    7:37 AM 02/16/2021    8:56 AM  Depression screen PHQ 2/9  Decreased Interest 0 0 0 3  Down, Depressed, Hopeless 0 0 0 0  PHQ - 2 Score 0 0 0 3  Altered sleeping 0 2    Tired, decreased energy 0 0    Change in appetite 0 0    Feeling bad or failure about yourself  0 0    Trouble concentrating 0 0    Moving slowly or fidgety/restless 0 0    Suicidal thoughts 0 0    PHQ-9 Score 0 2    Difficult doing work/chores Not difficult at all Not difficult at all          04/26/2023    7:37 AM  Fall Risk   Falls in the past year? 0  Number falls in past yr: 0  Injury with Fall? 0  Risk for fall due to : No Fall Risks  Follow up Follow up appointment     Patient Care Team: Sherre Clapper, MD as PCP - General (Family Medicine)   Review of Systems  Constitutional:  Negative for chills, fatigue and fever.  HENT:  Positive for congestion. Negative for ear pain and sore throat.   Respiratory:  Negative for cough and shortness of breath.   Cardiovascular:  Negative for chest pain.  Gastrointestinal:  Negative for abdominal pain, constipation, diarrhea, nausea and vomiting.  Genitourinary:  Negative for dysuria and urgency.  Musculoskeletal:  Negative for arthralgias and myalgias.  Skin:  Negative for rash.  Neurological:  Negative for dizziness and headaches.  Psychiatric/Behavioral:  Negative for dysphoric mood. The patient is not nervous/anxious.     Current Outpatient Medications on File Prior to Visit  Medication Sig Dispense Refill   aspirin  EC 81 MG tablet Take 81 mg by mouth daily. Swallow whole.     omeprazole  (PRILOSEC  OTC) 20 MG tablet Take 1 tablet (20 mg total) by mouth daily as needed (heartburn/indigestion). 90 tablet 2   No current facility-administered medications on file prior to visit.   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/18/2013   Past Surgical History:  Procedure Laterality Date   CHOLECYSTECTOMY     2004   IR ANGIO INTRA EXTRACRAN SEL INTERNAL CAROTID BILAT MOD SED  10/14/2017   IR ANGIO INTRA EXTRACRAN SEL INTERNAL CAROTID BILAT MOD SED  03/06/2019   IR ANGIO INTRA EXTRACRAN SEL INTERNAL CAROTID UNI L MOD SED  12/09/2017   IR ANGIO INTRA EXTRACRAN SEL INTERNAL CAROTID UNI L MOD SED  06/02/2018   IR ANGIO VERTEBRAL SEL VERTEBRAL BILAT MOD SED  10/14/2017   IR ANGIO VERTEBRAL SEL VERTEBRAL UNI R MOD SED   03/06/2019   IR ANGIOGRAM FOLLOW UP STUDY  12/09/2017   IR TRANSCATH/EMBOLIZ  12/09/2017   IR US  GUIDE VASC ACCESS RIGHT  03/06/2019   RADIOLOGY WITH ANESTHESIA N/A 12/09/2017   Procedure: Embolization of aneurysm;  Surgeon: Lanis Pupa, MD;  Location[facility] OR;  Service: Radiology;  Laterality: N/A;   TUBAL LIGATION     2005   WRIST SURGERY Left    2017- screws and plates    Family History  Problem Relation Age of Onset   Hypertension Mother    Heart attack Mother    Hypertension Father    Heart attack Father    Heart attack Brother    Hypertension Brother    Hyperlipidemia Brother    Heart failure Brother    Breast cancer Neg Hx    Social History   Socioeconomic History   Marital status: Significant Other    Spouse name: Not on file   Number of children: 0   Years of education: Not on file   Highest education level: Not on file  Occupational History   Not on file  Tobacco Use   Smoking status: Every Day    Current packs/day: 1.50    Average packs/day: 1.5 packs/day for 42.0 years (63.0 ttl pk-yrs)    Types: Cigarettes   Smokeless tobacco: Never  Vaping Use   Vaping status: Never Used  Substance and Sexual Activity   Alcohol use: Yes    Alcohol/week: 2.0 standard drinks of alcohol    Types: 2 Cans of beer per week    Comment: socially   Drug use: Never   Sexual activity: Yes    Partners: Male  Other Topics Concern   Not on file  Social History Narrative   Not on file   Social Drivers of Health   Financial Resource Strain: Low Risk  (10/20/2022)   Overall Financial Resource Strain (CARDIA)    Difficulty of Paying Living Expenses: Not hard at all  Food Insecurity: No Food Insecurity (10/20/2022)   Hunger Vital Sign    Worried About Running Out of Food in the Last Year: Never true    Ran Out of Food in the Last Year: Never true  Transportation Needs: No Transportation Needs (10/20/2022)   PRAPARE - Administrator, Civil Service (Medical): No     Lack of Transportation (Non-Medical): No  Physical Activity: Inactive (10/20/2022)   Exercise Vital Sign    Days of Exercise per Week: 0 days    Minutes of Exercise per Session: 0 min  Stress: No Stress Concern Present (10/20/2022)  [name] of Occupational Health - Occupational Stress Questionnaire    Feeling of Stress : Not at all  Social Connections: Socially Isolated (10/20/2022)   Social Connection and Isolation Panel [NHANES]    Frequency of Communication with Friends and Family: More than three times a week    Frequency of Social Gatherings with Friends and Family: More than three times a week    Attends Religious Services: Never    Database Administrator or Organizations: No    Attends Engineer, Structural: Never    Marital Status: Divorced    Objective:  BP (!) 158/92   Pulse 85   Temp 98 F (36.7 C) (Temporal)   Resp 16   Ht 5' 5 (1.651 m)   Wt 127 lb 12.8 oz (58 kg)   SpO2 100%   BMI 21.27 kg/m      04/26/2023    7:57 AM 04/26/2023    7:31 AM 11/25/2022    9:05 AM  BP/Weight  Systolic BP 158 160 150  Diastolic BP 92 62 90  Wt. (Lbs)  127.8   BMI  21.27 kg/m2     Physical Exam Vitals reviewed.  Constitutional:      Appearance: Normal appearance. She is normal weight.  Neck:     Vascular: No carotid bruit.  Cardiovascular:     Rate and Rhythm: Normal rate and regular rhythm.     Heart sounds: Normal heart sounds.  Pulmonary:     Effort: Pulmonary effort is normal. No respiratory distress.     Breath sounds: Normal breath sounds.  Abdominal:     General: Abdomen is flat. Bowel sounds are normal.     Palpations: Abdomen is soft.     Tenderness: There is no abdominal tenderness.  Neurological:     Mental Status: She is alert and oriented to person, place, and time.     Motor: Weakness (left leg 4/5 - very mild.) present.  Psychiatric:        Mood and Affect: Mood normal.        Behavior: Behavior normal.     Diabetic Foot Exam -  Simple   No data filed      Lab Results  Component Value Date   WBC 8.2 10/20/2022   HGB 15.7 10/20/2022   HCT 47.7 (H) 10/20/2022   PLT 236 10/20/2022   GLUCOSE 103 (H) 11/05/2022   CHOL 145 10/20/2022   TRIG 71 10/20/2022   HDL 60 10/20/2022   LDLCALC 71 10/20/2022   ALT 19 11/05/2022   AST 19 11/05/2022   NA 142 11/05/2022   K 5.0 11/05/2022   CL 103 11/05/2022   CREATININE 0.62 11/05/2022   BUN 10 11/05/2022   CO2 26 11/05/2022   TSH 0.718 10/20/2022   INR 0.9 03/06/2019      Assessment & Plan:    Essential (primary) hypertension Assessment & Plan: Uncontrolled.  Increase losartan  to 100 mg daily.  Check bp once daily one hour after taking medicine.  Follow up in 2-3 weeks for bp visit  Orders: -     CBC with Differential/Platelet -     Comprehensive metabolic panel -     Losartan  Potassium; Take 1 tablet (100 mg total) by mouth daily.  Dispense: 90 tablet; Refill: 3  Mixed hyperlipidemia Assessment & Plan: Controlled Continue taking Lipitor  80mg  as directed Continue to monitor diet and exercise  Orders: -     Lipid panel -  Atorvastatin  Calcium ; TAKE 1 TABLET(80 MG) BY MOUTH EVERY DAY  Dispense: 90 tablet; Refill: 3  Nicotine dependence with current use Assessment & Plan: Tobacco Use Patient continues to smoke approximately 1.5 packs per day. No desire to quit at this time. -Patient declined lung cancer screening with low-dose CT.  General Health Maintenance -Administer Prevnar 20 and COVID-19 vaccines today.   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  Good control of hypertension, hyperlipidemia is important.  Continue aspirin  81 mg daily.       Meds ordered this encounter  Medications   losartan  (COZAAR ) 100  MG tablet    Sig: Take 1 tablet (100 mg total) by mouth daily.    Dispense:  90 tablet    Refill:  3   atorvastatin  (LIPITOR ) 80 MG tablet    Sig: TAKE 1 TABLET(80 MG) BY MOUTH EVERY DAY    Dispense:  90 tablet    Refill:  3    Orders Placed This Encounter  Procedures   Pfizer Comirnaty Covid-19 Vaccine 6yrs & older   Pneumococcal conjugate vaccine 20-valent   CBC with Differential/Platelet   Comprehensive metabolic panel   Lipid panel     Follow-up: Return in about 1 year (around 04/25/2024) for chronic follow up, awv when due, Nurse visit for hypertension in 2-3 weeks.   I,Marla I Leal-Borjas,acting as a scribe for Abigail Free, MD.,have documented all relevant documentation on the behalf of Abigail Free, MD,as directed by  Abigail Free, MD while in the presence of Abigail Free, MD.   An After Visit Summary was printed and given to the patient.  Abigail Free, MD [facility] [phone]

## 2023-04-26 ENCOUNTER — Encounter: Payer: Self-pay | Admitting: Family Medicine

## 2023-04-26 ENCOUNTER — Ambulatory Visit: Payer: 59 | Admitting: Family Medicine

## 2023-04-26 VITALS — BP 158/92 | HR 85 | Temp 98.0°F | Resp 16 | Ht 65.0 in | Wt 127.8 lb

## 2023-04-26 DIAGNOSIS — F17200 Nicotine dependence, unspecified, uncomplicated: Secondary | ICD-10-CM | POA: Diagnosis not present

## 2023-04-26 DIAGNOSIS — I69354 Hemiplegia and hemiparesis following cerebral infarction affecting left non-dominant side: Secondary | ICD-10-CM

## 2023-04-26 DIAGNOSIS — Z23 Encounter for immunization: Secondary | ICD-10-CM

## 2023-04-26 DIAGNOSIS — E782 Mixed hyperlipidemia: Secondary | ICD-10-CM | POA: Diagnosis not present

## 2023-04-26 DIAGNOSIS — I10 Essential (primary) hypertension: Secondary | ICD-10-CM

## 2023-04-26 MED ORDER — ATORVASTATIN CALCIUM 80 MG PO TABS: ORAL_TABLET | ORAL | 3 refills | Status: DC

## 2023-04-26 MED ORDER — LOSARTAN POTASSIUM 100 MG PO TABS: 100.0000 mg | ORAL_TABLET | Freq: Every day | ORAL | 3 refills | Status: DC

## 2023-04-26 NOTE — Assessment & Plan Note (Signed)
 Uncontrolled.  Increase losartan to 100 mg daily.  Check bp once daily one hour after taking medicine.  Follow up in 2-3 weeks for bp visit

## 2023-04-26 NOTE — Assessment & Plan Note (Signed)
 Still has mild weakness of left leg.  Good control of hypertension, hyperlipidemia is important.  Continue aspirin 81 mg daily.

## 2023-04-26 NOTE — Assessment & Plan Note (Signed)
 Controlled Continue taking Lipitor 80mg  as directed Continue to monitor diet and exercise

## 2023-04-26 NOTE — Patient Instructions (Addendum)
 VISIT SUMMARY:  During your routine check-up, we discussed your current health status and made some adjustments to your treatment plan. You reported no significant symptoms and shared that you are currently smoking and have no desire to quit. We also reviewed your vaccination status and general health maintenance.  YOUR PLAN:  -HYPERTENSION: Hypertension means high blood pressure. We increased your Losartan  dose to 100mg  daily and advised you to monitor your blood pressure at home at least once a day, an hour after taking your medication. We also scheduled a nurse visit to recheck your blood pressure after the dose adjustment in 2-3 weeks.  -HYPERLIPIDEMIA: Hyperlipidemia means high levels of fats in the blood. Your condition is well controlled with Atorvastatin , so you should continue taking it. We also ordered a cholesterol panel, blood count, and chemistry panel to monitor your levels.  -TOBACCO USE: You are currently smoking about 1.5 packs per day and have no desire to quit. You declined lung cancer screening with a low-dose CT scan.  -GENERAL HEALTH MAINTENANCE: We administered the Prevnar 20 and COVID-19 vaccines today. You received your flu vaccine on 03/06/2023 at [facility]. You declined colon cancer screening with a colonoscopy or Cologuard test.  INSTRUCTIONS:  Please follow up for your Medicare wellness visit. Additionally, monitor your blood pressure at home daily and attend the nurse visit to recheck your blood pressure after the dose adjustment.

## 2023-04-26 NOTE — Assessment & Plan Note (Signed)
 Tobacco Use Patient continues to smoke approximately 1.5 packs per day. No desire to quit at this time. -Patient declined lung cancer screening with low-dose CT.  General Health Maintenance -Administer Prevnar 20 and COVID-19 vaccines today.

## 2023-04-27 LAB — LIPID PANEL
Chol/HDL Ratio: 2.4 {ratio} (ref 0.0–4.4)
Cholesterol, Total: 156 mg/dL (ref 100–199)
HDL: 66 mg/dL (ref >39)
LDL Chol Calc (NIH): 71 mg/dL (ref 0–99)
Triglycerides: 109 mg/dL (ref 0–149)
VLDL Cholesterol Cal: 19 mg/dL (ref 5–40)

## 2023-04-27 LAB — CBC WITH DIFFERENTIAL/PLATELET
Basophils Absolute: 0 10*3/uL (ref 0.0–0.2)
Basos: 1 %
EOS (ABSOLUTE): 0.2 10*3/uL (ref 0.0–0.4)
Eos: 3 %
Hematocrit: 44.7 % (ref 34.0–46.6)
Hemoglobin: 15 g/dL (ref 11.1–15.9)
Immature Grans (Abs): 0 10*3/uL (ref 0.0–0.1)
Immature Granulocytes: 0 %
Lymphocytes Absolute: 2.1 10*3/uL (ref 0.7–3.1)
Lymphs: 32 %
MCH: 28.6 pg (ref 26.6–33.0)
MCHC: 33.6 g/dL (ref 31.5–35.7)
MCV: 85 fL (ref 79–97)
Monocytes Absolute: 0.5 10*3/uL (ref 0.1–0.9)
Monocytes: 8 %
Neutrophils Absolute: 3.7 10*3/uL (ref 1.4–7.0)
Neutrophils: 56 %
Platelets: 205 10*3/uL (ref 150–450)
RBC: 5.25 x10E6/uL (ref 3.77–5.28)
RDW: 13.2 % (ref 11.7–15.4)
WBC: 6.6 10*3/uL (ref 3.4–10.8)

## 2023-04-27 LAB — COMPREHENSIVE METABOLIC PANEL
ALT: 24 [IU]/L (ref 0–32)
AST: 24 [IU]/L (ref 0–40)
Albumin: 4.8 g/dL (ref 3.9–4.9)
Alkaline Phosphatase: 139 [IU]/L — ABNORMAL HIGH (ref 44–121)
BUN/Creatinine Ratio: 18 (ref 12–28)
BUN: 9 mg/dL (ref 8–27)
Bilirubin Total: 0.6 mg/dL (ref 0.0–1.2)
CO2: 22 mmol/L (ref 20–29)
Calcium: 9.8 mg/dL (ref 8.7–10.3)
Chloride: 106 mmol/L (ref 96–106)
Creatinine, Ser: 0.5 mg/dL — ABNORMAL LOW (ref 0.57–1.00)
Globulin, Total: 2 g/dL (ref 1.5–4.5)
Glucose: 93 mg/dL (ref 70–99)
Potassium: 4.8 mmol/L (ref 3.5–5.2)
Sodium: 146 mmol/L — ABNORMAL HIGH (ref 134–144)
Total Protein: 6.8 g/dL (ref 6.0–8.5)
eGFR: 105 mL/min/{1.73_m2} (ref >59)

## 2023-05-02 ENCOUNTER — Other Ambulatory Visit: Payer: Self-pay | Admitting: Physician Assistant

## 2023-05-12 ENCOUNTER — Ambulatory Visit (INDEPENDENT_AMBULATORY_CARE_PROVIDER_SITE_OTHER): Payer: 59 | Admitting: Family Medicine

## 2023-05-12 VITALS — BP 150/92 | HR 78 | Temp 97.6°F | Ht 65.0 in | Wt 127.0 lb

## 2023-05-12 DIAGNOSIS — I10 Essential (primary) hypertension: Secondary | ICD-10-CM | POA: Diagnosis not present

## 2023-05-12 DIAGNOSIS — J209 Acute bronchitis, unspecified: Secondary | ICD-10-CM | POA: Diagnosis not present

## 2023-05-12 DIAGNOSIS — F17200 Nicotine dependence, unspecified, uncomplicated: Secondary | ICD-10-CM

## 2023-05-12 MED ORDER — AMOXICILLIN-POT CLAVULANATE 875-125 MG PO TABS: 1.0000 | ORAL_TABLET | Freq: Two times a day (BID) | ORAL | 0 refills | Status: DC

## 2023-05-12 MED ORDER — ALBUTEROL SULFATE HFA 108 (90 BASE) MCG/ACT IN AERS: 2.0000 | INHALATION_SPRAY | Freq: Four times a day (QID) | RESPIRATORY_TRACT | 2 refills | Status: DC | PRN

## 2023-05-12 MED ORDER — TRIAMCINOLONE ACETONIDE 40 MG/ML IJ SUSP
80.0000 mg | Freq: Once | INTRAMUSCULAR | Status: AC
  Administered 2023-05-12: 80 mg via INTRAMUSCULAR

## 2023-05-12 MED ORDER — PREDNISONE 50 MG PO TABS: 50.0000 mg | ORAL_TABLET | Freq: Every day | ORAL | 0 refills | Status: DC

## 2023-05-12 MED ORDER — VALSARTAN-HYDROCHLOROTHIAZIDE 160-12.5 MG PO TABS: 1.0000 | ORAL_TABLET | Freq: Every day | ORAL | 0 refills | Status: DC

## 2023-05-12 NOTE — Patient Instructions (Addendum)
Start on Augmentin 875 twice daily for 10 days, prednisone 50 mg once daily in a.m. for 5 days, Flonase 2 puffs each nostril twice daily, and Albuterol 2 puffs 4 times a day as needed for wheezing.  For hypertension: Recommend discontinue losartan.  Start on valsartan/HCT 160/12.5 mg once daily.   Check blood pressure daily 1 to 2 hours after taking your medicine.  Bring log in approximately 2 weeks.

## 2023-05-12 NOTE — Progress Notes (Signed)
Acute Office Visit  Subjective:    Patient ID: Michelle Montes, female    DOB: 08-01-1960, 63 y.o.   MRN: [id number]  Chief Complaint  Patient presents with   Sinusitis    History of Present Illness      Patient is a 63 year old white female who currently smokes who presented for blood pressure recheck.  She was having congestion and cough over the last week.  Patient has been taking Mucinex and using a Nettie pot.  These have helped but not resolved her symptoms. She has been checking her blood pressure at home and it has been systolic blood pressure in the 150s despite [place] increasing her losartan to 100 mg once a day.  She has brought her blood pressure cuff today to compare with ours.  Her initial blood pressure was high.  Patient denies fevers, chills, sweats, chest pain, shortness of breath, earaches.  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    Past Surgical History:  Procedure Laterality Date   CHOLECYSTECTOMY     2004   IR ANGIO INTRA EXTRACRAN SEL INTERNAL CAROTID BILAT MOD SED  10/14/2017   IR ANGIO INTRA EXTRACRAN SEL INTERNAL CAROTID BILAT MOD SED  03/06/2019   IR ANGIO INTRA EXTRACRAN SEL INTERNAL CAROTID UNI L MOD SED  12/09/2017   IR ANGIO INTRA EXTRACRAN SEL INTERNAL CAROTID UNI L MOD SED  06/02/2018   IR ANGIO VERTEBRAL SEL VERTEBRAL BILAT MOD SED  10/14/2017   IR ANGIO VERTEBRAL SEL VERTEBRAL UNI R MOD SED  03/06/2019   IR ANGIOGRAM FOLLOW UP STUDY  12/09/2017   IR TRANSCATH/EMBOLIZ  12/09/2017   IR US GUIDE VASC ACCESS RIGHT  03/06/2019   RADIOLOGY WITH ANESTHESIA N/A 12/09/2017   Procedure: Embolization of aneurysm;  Surgeon: Lisbeth Renshaw, MD;  Location: [facility]  OR;  Service: Radiology;  Laterality: N/A;   TUBAL LIGATION     2005   WRIST SURGERY Left    2017- screws and plates    Family History  Problem Relation Age of Onset   Hypertension Mother    Heart attack Mother    Hypertension Father    Heart attack Father    Heart attack Brother    Hypertension Brother    Hyperlipidemia Brother    Heart failure Brother    Breast cancer Neg Hx     Social History   Socioeconomic History   Marital status: Significant Other    Spouse name: Not on file   Number of children: 0   Years of education: Not on file   Highest education level: Not on file  Occupational History   Not on file  Tobacco Use   Smoking status: Every Day    Current packs/day: 1.50    Average packs/day: 1.5 packs/day for 42.0 years (63.0 ttl pk-yrs)    Types: Cigarettes   Smokeless tobacco: Never  Vaping Use   Vaping status: Never Used  Substance and Sexual Activity   Alcohol use: Yes    Alcohol/week: 2.0 standard drinks of alcohol    Types: 2 Cans of beer per week    Comment: socially   Drug use: Never   Sexual  activity: Yes    Partners: Male  Other Topics Concern   Not on file  Social History Narrative   Not on file   Social Drivers of Health   Financial Resource Strain: Low Risk  (10/20/2022)   Overall Financial Resource Strain (CARDIA)    Difficulty of Paying Living Expenses: Not hard at all  Food Insecurity: No Food Insecurity (10/20/2022)   Hunger Vital Sign    Worried About Running Out of Food in the Last Year: Never true    Ran Out of Food in the Last Year: Never true  Transportation Needs: No Transportation Needs (10/20/2022)   PRAPARE - Administrator, Civil Service (Medical): No    Lack of Transportation (Non-Medical): No  Physical Activity: Inactive (10/20/2022)   Exercise Vital Sign    Days of Exercise per Week: 0 days    Minutes of Exercise per Session: 0 min  Stress: No Stress Concern Present (10/20/2022)   [name] of  Occupational Health - Occupational Stress Questionnaire    Feeling of Stress : Not at all  Social Connections: Socially Isolated (10/20/2022)   Social Connection and Isolation Panel [NHANES]    Frequency of Communication with Friends and Family: More than three times a week    Frequency of Social Gatherings with Friends and Family: More than three times a week    Attends Religious Services: Never    Database administrator or Organizations: No    Attends Banker Meetings: Never    Marital Status: Divorced  Catering manager Violence: Not At Risk (10/20/2022)   Humiliation, Afraid, Rape, and Kick questionnaire    Fear of Current or Ex-Partner: No    Emotionally Abused: No    Physically Abused: No    Sexually Abused: No    Outpatient Medications Prior to Visit  Medication Sig Dispense Refill   aspirin EC 81 MG tablet Take 81 mg by mouth daily. Swallow whole.     atorvastatin (LIPITOR) 80 MG tablet TAKE 1 TABLET(80 MG) BY MOUTH EVERY DAY 90 tablet 3   losartan (COZAAR) 100 MG tablet Take 1 tablet (100 mg total) by mouth daily. 90 tablet 3   omeprazole (PRILOSEC OTC) 20 MG tablet Take 1 tablet (20 mg total) by mouth daily as needed (heartburn/indigestion). 90 tablet 2   No facility-administered medications prior to visit.    No Known Allergies  Review of Systems  Constitutional:  Negative for chills, fatigue and fever.  HENT:  Positive for congestion. Negative for ear pain and sore throat.   Respiratory:  Positive for cough. Negative for shortness of breath.   Cardiovascular:  Negative for chest pain.       Objective:        05/12/2023    8:10 AM 05/12/2023    8:09 AM 04/26/2023    7:57 AM  Vitals with BMI  Height 5\' 5"     Weight 127 lbs    BMI 21.13    Systolic 150 168 409  Diastolic 92 62 92  Pulse 78      No data found.   Physical Exam Vitals reviewed.  Constitutional:      Appearance: Normal appearance.  HENT:     Right Ear: Tympanic membrane, ear  canal and external ear normal.     Left Ear: Tympanic membrane, ear canal and external ear normal.     Nose: Congestion present.     Mouth/Throat:     Pharynx: Oropharynx is clear.  Cardiovascular:     Rate and Rhythm: Normal rate and regular rhythm.     Heart sounds: Normal heart sounds. No murmur heard. Pulmonary:     Effort: Pulmonary effort is normal. No respiratory distress.     Breath sounds: Wheezing present.  Neurological:     Mental Status: She is alert and oriented to person, place, and time.  Psychiatric:        Mood and Affect: Mood normal.        Behavior: Behavior normal.     There are no preventive care reminders to display for this patient.  There are no preventive care reminders to display for this patient.   Lab Results  Component Value Date   TSH 0.718 10/20/2022   Lab Results  Component Value Date   WBC 6.6 04/26/2023   HGB 15.0 04/26/2023   HCT 44.7 04/26/2023   MCV 85 04/26/2023   PLT 205 04/26/2023   Lab Results  Component Value Date   NA 146 (H) 04/26/2023   K 4.8 04/26/2023   CO2 22 04/26/2023   GLUCOSE 93 04/26/2023   BUN 9 04/26/2023   CREATININE 0.50 (L) 04/26/2023   BILITOT 0.6 04/26/2023   ALKPHOS 139 (H) 04/26/2023   AST 24 04/26/2023   ALT 24 04/26/2023   PROT 6.8 04/26/2023   ALBUMIN 4.8 04/26/2023   CALCIUM 9.8 04/26/2023   ANIONGAP 7 03/06/2019   EGFR 105 04/26/2023   Lab Results  Component Value Date   CHOL 156 04/26/2023   Lab Results  Component Value Date   HDL 66 04/26/2023   Lab Results  Component Value Date   LDLCALC 71 04/26/2023   Lab Results  Component Value Date   TRIG 109 04/26/2023   Lab Results  Component Value Date   CHOLHDL 2.4 04/26/2023   No results found for: "HGBA1C"     Assessment & Plan:  Acute bronchitis, unspecified organism Assessment & Plan: Start on Augmentin 875 twice daily for 10 days, prednisone 50 mg once daily in a.m. for 5 days, Flonase 2 puffs each nostril twice daily,  and Albuterol 2 puffs 4 times a day as needed for wheezing.   Orders: -     Triamcinolone Acetonide  Essential (primary) hypertension Assessment & Plan: Recommend discontinue losartan.  Start on valsartan/HCT 160/12.5 mg once daily.   Check blood pressure daily 1 to 2 hours after taking your medicine.  Bring log in approximately 2 weeks.   Nicotine dependence with current use Assessment & Plan: Patient continues to smoke approximately 1.5 packs per day. No desire to quit at this time.     Other orders -     Amoxicillin-Pot Clavulanate; Take 1 tablet by mouth 2 (two) times daily.  Dispense: 20 tablet; Refill: 0 -     predniSONE; Take 1 tablet (50 mg total) by mouth daily with breakfast.  Dispense: 5 tablet; Refill: 0 -     Valsartan-hydroCHLOROthiazide; Take 1 tablet by mouth daily.  Dispense: 90 tablet; Refill: 0 -     Albuterol Sulfate HFA; Inhale 2 puffs into the lungs every 6 (six) hours as needed for wheezing or shortness of breath.  Dispense: 8 g; Refill: 2     Meds ordered this encounter  Medications   triamcinolone acetonide (KENALOG-40) injection 80 mg   amoxicillin-clavulanate (AUGMENTIN) 875-125 MG tablet    Sig: Take 1 tablet by mouth 2 (two) times daily.    Dispense:  20 tablet    Refill:  0  predniSONE (DELTASONE) 50 MG tablet    Sig: Take 1 tablet (50 mg total) by mouth daily with breakfast.    Dispense:  5 tablet    Refill:  0   valsartan-hydrochlorothiazide (DIOVAN-HCT) 160-12.5 MG tablet    Sig: Take 1 tablet by mouth daily.    Dispense:  90 tablet    Refill:  0   albuterol (VENTOLIN HFA) 108 (90 Base) MCG/ACT inhaler    Sig: Inhale 2 puffs into the lungs every 6 (six) hours as needed for wheezing or shortness of breath.    Dispense:  8 g    Refill:  2    No orders of the defined types were placed in this encounter.    Follow-up: Return in about 2 weeks (around 05/26/2023) for chronic follow up, [place] [facility] - hypertension follow up and Peak flow testing.  Marland Kitchen  An After Visit Summary was printed and given to the patient.   Clayborn Bigness I Leal-Borjas,acting as a scribe for Blane Ohara, MD.,have documented all relevant documentation on the behalf of Blane Ohara, MD,as directed by  Blane Ohara, MD while in the presence of Blane Ohara, MD.   Blane Ohara, MD [facility] [phone]

## 2023-05-15 ENCOUNTER — Encounter: Payer: Self-pay | Admitting: Family Medicine

## 2023-05-15 NOTE — Assessment & Plan Note (Signed)
Start on Augmentin 875 twice daily for 10 days, prednisone 50 mg once daily in a.m. for 5 days, Flonase 2 puffs each nostril twice daily, and Albuterol 2 puffs 4 times a day as needed for wheezing.

## 2023-05-15 NOTE — Assessment & Plan Note (Signed)
Recommend discontinue losartan.  Start on valsartan/HCT 160/12.5 mg once daily.   Check blood pressure daily 1 to 2 hours after taking your medicine.  Bring log in approximately 2 weeks.

## 2023-05-15 NOTE — Assessment & Plan Note (Signed)
Patient continues to smoke approximately 1.5 packs per day. No desire to quit at this time.

## 2023-06-07 ENCOUNTER — Ambulatory Visit: Payer: 59 | Admitting: Family Medicine

## 2023-06-07 ENCOUNTER — Encounter: Payer: Self-pay | Admitting: Family Medicine

## 2023-06-07 VITALS — BP 138/72 | HR 91 | Temp 97.6°F | Resp 16 | Ht 65.0 in | Wt 119.6 lb

## 2023-06-07 DIAGNOSIS — I10 Essential (primary) hypertension: Secondary | ICD-10-CM

## 2023-06-07 DIAGNOSIS — J302 Other seasonal allergic rhinitis: Secondary | ICD-10-CM | POA: Insufficient documentation

## 2023-06-07 NOTE — Assessment & Plan Note (Signed)
 Chronic Improved blood pressure control on Losartan 100mg  daily. No reported side effects. Patient has a history of hyperkalemia, and there was a discussion about the potential for Losartan to cause hyperkalemia. The patient's potassium level was last checked on April 26, 2023 and was within normal limits. -Continue Losartan 100mg  daily. -Advise patient to limit high potassium foods, attached a list on her AVS -Advise patient to monitor for symptoms of hypertension and hypotension.

## 2023-06-07 NOTE — Patient Instructions (Signed)
 Potassium-rich foods include:   Almonds Avocado Bananas Cashews Leafy greens Flaxseeds Lentils Oranges Potatoes White beans

## 2023-06-07 NOTE — Assessment & Plan Note (Signed)
 Patient inquiring about over-the-counter allergy medications. -Provide samples of Zyrtec for patient to try.

## 2023-06-07 NOTE — Progress Notes (Signed)
 Subjective:  Patient ID: Michelle Montes, female    DOB: Jul 03, 1960  Age: 63 y.o. MRN: [id number]  Chief Complaint  Patient presents with   Hypertension    HPI Michelle Montes is a 63 year old female with hypertension who presents for medication management and follow-up.  Her blood pressure medication was recently changed due to elevated potassium levels, and she is now taking losartan, which was increased to 100 mg. She is concerned about the medication label indicating potassium, as her previous medication was changed to avoid high potassium levels. Her potassium levels were high in July 2024 but have been stable since then. She is scheduled for a follow-up lab check in April 2025.  No issues with dizziness or other side effects from the current medication. She monitors her blood pressure at home and recalls a past experience where her blood pressure medication caused her to feel faint.  She discusses her dietary habits, noting that she occasionally consumes foods high in potassium, such as bananas and potatoes, and acknowledges that these foods might contribute to her potassium levels.  Seasonal allergies with raking leaves. Would like recommendations on a anti-histamine.  Currently taking Losartan 100 mg daily, valsartan-hydrochlorothiazide 160-12.5 mg daily.     06/07/2023    8:42 AM 04/26/2023    7:37 AM 10/20/2022    9:05 AM 04/07/2022    7:37 AM 02/16/2021    8:56 AM  Depression screen PHQ 2/9  Decreased Interest 0 0 0 0 3  Down, Depressed, Hopeless 0 0 0 0 0  PHQ - 2 Score 0 0 0 0 3  Altered sleeping 0 0 2    Tired, decreased energy 0 0 0    Change in appetite 0 0 0    Feeling bad or failure about yourself  0 0 0    Trouble concentrating 0 0 0    Moving slowly or fidgety/restless 0 0 0    Suicidal thoughts 0 0 0    PHQ-9 Score 0 0 2    Difficult doing work/chores Not difficult at all Not difficult at all Not difficult at all          06/07/2023    8:41 AM  Fall Risk    Falls in the past year? 0  Number falls in past yr: 0  Injury with Fall? 0  Risk for fall due to : No Fall Risks  Follow up Falls evaluation completed    Patient Care Team: Blane Ohara, MD as PCP - General (Family Medicine)   Review of Systems  Constitutional:  Negative for chills, diaphoresis, fatigue and fever.  HENT:  Positive for congestion. Negative for ear pain and sinus pain.   Respiratory:  Negative for cough and shortness of breath.   Cardiovascular:  Negative for chest pain.  Gastrointestinal:  Negative for abdominal pain, constipation, nausea and vomiting.  Genitourinary:  Negative for dysuria.  Musculoskeletal:  Negative for arthralgias.  Neurological:  Negative for weakness and headaches.  Psychiatric/Behavioral:  Negative for dysphoric mood. The patient is not nervous/anxious.     Current Outpatient Medications on File Prior to Visit  Medication Sig Dispense Refill   albuterol (VENTOLIN HFA) 108 (90 Base) MCG/ACT inhaler Inhale 2 puffs into the lungs every 6 (six) hours as needed for wheezing or shortness of breath. 8 g 2   aspirin EC 81 MG tablet Take 81 mg by mouth daily. Swallow whole.     atorvastatin (LIPITOR) 80 MG tablet TAKE 1  TABLET(80 MG) BY MOUTH EVERY DAY 90 tablet 3   losartan (COZAAR) 100 MG tablet Take 1 tablet (100 mg total) by mouth daily. 90 tablet 3   omeprazole (PRILOSEC OTC) 20 MG tablet Take 1 tablet (20 mg total) by mouth daily as needed (heartburn/indigestion). 90 tablet 2   valsartan-hydrochlorothiazide (DIOVAN-HCT) 160-12.5 MG tablet Take 1 tablet by mouth daily. 90 tablet 0   No current facility-administered medications on file prior to visit.   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   Past Surgical History:  Procedure Laterality Date   CHOLECYSTECTOMY     2004   IR ANGIO INTRA EXTRACRAN SEL INTERNAL CAROTID BILAT MOD SED  10/14/2017   IR ANGIO INTRA EXTRACRAN SEL INTERNAL CAROTID BILAT MOD SED  03/06/2019   IR ANGIO INTRA EXTRACRAN SEL INTERNAL CAROTID UNI L MOD SED  12/09/2017   IR ANGIO INTRA EXTRACRAN SEL INTERNAL CAROTID UNI L MOD SED  06/02/2018   IR ANGIO VERTEBRAL SEL VERTEBRAL BILAT MOD SED  10/14/2017   IR ANGIO VERTEBRAL SEL VERTEBRAL UNI R MOD SED  03/06/2019   IR ANGIOGRAM FOLLOW UP STUDY  12/09/2017   IR TRANSCATH/EMBOLIZ  12/09/2017   IR US GUIDE VASC ACCESS RIGHT  03/06/2019   RADIOLOGY WITH ANESTHESIA N/A 12/09/2017   Procedure: Embolization of aneurysm;  Surgeon: Lisbeth Renshaw, MD;  [facility];  Service: Radiology;  Laterality: N/A;   TUBAL LIGATION     2005   WRIST SURGERY Left    2017- screws and plates    Family History  Problem Relation Age of Onset   Hypertension Mother    Heart attack Mother    Hypertension Father    Heart attack Father    Heart attack Brother    Hypertension Brother    Hyperlipidemia Brother    Heart failure Brother    Breast cancer Neg Hx    Social History   Socioeconomic History   Marital status: Significant Other    Spouse name: Not on file   Number of children: 0   Years of education: Not on file   Highest education level: Not on file  Occupational History   Not on file  Tobacco Use   Smoking status: Every Day    Current packs/day: 1.50    Average packs/day: 1.5 packs/day for 42.0 years (63.0 ttl pk-yrs)    Types: Cigarettes   Smokeless tobacco: Never  Vaping Use   Vaping status: Never Used  Substance and Sexual Activity   Alcohol use: Yes    Alcohol/week: 2.0 standard drinks of alcohol    Types: 2 Cans of beer per week    Comment: socially   Drug use: Never   Sexual activity: Yes    Partners: Male  Other Topics Concern   Not on file   Social History Narrative   Not on file   Social Drivers of Health   Financial Resource Strain: Low Risk  (10/20/2022)   Overall Financial Resource Strain (CARDIA)    Difficulty of Paying Living Expenses: Not hard at all  Food Insecurity: No Food Insecurity (10/20/2022)   Hunger Vital Sign    Worried About Running Out  of Food in the Last Year: Never true    Ran Out of Food in the Last Year: Never true  Transportation Needs: No Transportation Needs (10/20/2022)   PRAPARE - Administrator, Civil Service (Medical): No    Lack of Transportation (Non-Medical): No  Physical Activity: Inactive (10/20/2022)   Exercise Vital Sign    Days of Exercise per Week: 0 days    Minutes of Exercise per Session: 0 min  Stress: No Stress Concern Present (10/20/2022)   [name] of Occupational Health - Occupational Stress Questionnaire    Feeling of Stress : Not at all  Social Connections: Socially Isolated (10/20/2022)   Social Connection and Isolation Panel [NHANES]    Frequency of Communication with Friends and Family: More than three times a week    Frequency of Social Gatherings with Friends and Family: More than three times a week    Attends Religious Services: Never    Database administrator or Organizations: No    Attends Engineer, structural: Never    Marital Status: Divorced    Objective:  BP 138/72 (BP Location: Right Arm, Patient Position: Sitting, Cuff Size: Normal)   Pulse 91   Temp 97.6 F (36.4 C) (Temporal)   Resp 16   Ht 5\' 5"  (1.651 m)   Wt 119 lb 9.6 oz (54.3 kg)   SpO2 97%   BMI 19.90 kg/m      06/07/2023    8:36 AM 05/12/2023    8:10 AM 05/12/2023    8:09 AM  BP/Weight  Systolic BP 138 150 168  Diastolic BP 72 92 62  Wt. (Lbs) 119.6 127   BMI 19.9 kg/m2 21.13 kg/m2     Physical Exam Constitutional:      General: She is not in acute distress.    Appearance: Normal appearance. She is not ill-appearing.  Eyes:     Conjunctiva/sclera:  Conjunctivae normal.  Neck:     Vascular: No carotid bruit.  Cardiovascular:     Rate and Rhythm: Normal rate and regular rhythm.     Heart sounds: Normal heart sounds. No murmur heard. Pulmonary:     Effort: Pulmonary effort is normal.     Breath sounds: Normal breath sounds. No wheezing.  Abdominal:     General: Bowel sounds are normal.     Palpations: Abdomen is soft.     Tenderness: There is no abdominal tenderness.  Musculoskeletal:        General: Normal range of motion.  Skin:    General: Skin is warm.  Neurological:     Mental Status: She is alert. Mental status is at baseline.  Psychiatric:        Mood and Affect: Mood normal.        Behavior: Behavior normal.      Lab Results  Component Value Date   WBC 6.6 04/26/2023   HGB 15.0 04/26/2023   HCT 44.7 04/26/2023   PLT 205 04/26/2023   GLUCOSE 93 04/26/2023   CHOL 156 04/26/2023   TRIG 109 04/26/2023   HDL 66 04/26/2023   LDLCALC 71 04/26/2023   ALT 24 04/26/2023   AST 24 04/26/2023   NA 146 (H) 04/26/2023   K 4.8 04/26/2023   CL 106 04/26/2023   CREATININE 0.50 (L) 04/26/2023   BUN 9 04/26/2023   CO2 22 04/26/2023   TSH 0.718 10/20/2022   INR 0.9 03/06/2019      Assessment & Plan:  Essential (primary) hypertension Assessment & Plan: Chronic Improved blood pressure control on Losartan 100mg  daily. No reported side effects. Patient has a history of hyperkalemia, and there was a discussion about the potential for Losartan to cause hyperkalemia. The patient's potassium level was last checked on April 26, 2023 and was within normal limits. -Continue Losartan 100mg  daily. -Advise patient to limit high potassium foods, attached a list on her AVS -Advise patient to monitor for symptoms of hypertension and hypotension.   Seasonal allergies Assessment & Plan: Patient inquiring about over-the-counter allergy medications. -Provide samples of Zyrtec for patient to try.      No orders of the defined  types were placed in this encounter.   No orders of the defined types were placed in this encounter.    Follow-up: Return for awv, scheduled in August.    An After Visit Summary was printed and given to the patient.  Total time spent on today's visit was 35 minutes, including both face-to-face time and nonface-to-face time personally spent on review of chart (labs and imaging), discussing labs and goals, discussing further work-up, treatment options, referrals to specialist if needed, reviewing outside records if pertinent, answering patient's questions, and coordinating care.    Lajuana Matte, FNP [facility] [phone]

## 2023-08-02 ENCOUNTER — Other Ambulatory Visit: Payer: Self-pay | Admitting: Family Medicine

## 2023-11-01 ENCOUNTER — Other Ambulatory Visit: Payer: Self-pay | Admitting: Family Medicine

## 2023-12-01 ENCOUNTER — Ambulatory Visit: Payer: 59 | Admitting: Family Medicine

## 2023-12-01 ENCOUNTER — Encounter: Payer: Self-pay | Admitting: Family Medicine

## 2023-12-01 VITALS — BP 122/68 | HR 78 | Temp 98.0°F | Ht 65.0 in | Wt 123.0 lb

## 2023-12-01 DIAGNOSIS — Z0000 Encounter for general adult medical examination without abnormal findings: Secondary | ICD-10-CM

## 2023-12-01 DIAGNOSIS — F17200 Nicotine dependence, unspecified, uncomplicated: Secondary | ICD-10-CM | POA: Diagnosis not present

## 2023-12-01 NOTE — Progress Notes (Signed)
 Subjective:    Michelle Montes is a 63 y.o. female who presents for a Welcome to Medicare exam.   Cardiac Risk Factors include: advanced age (>20men, >68 women);hypertension;dyslipidemia  She wears her seatbelt, practices appropriate gun safety. Patient has smoke and carbon monoxide detectors. Exercises some.   Discussed the use of AI scribe software for clinical note transcription with the patient, who gave verbal consent to proceed.  History of Present Illness   Michelle Montes is a 63 year old female who presents for a routine follow-up visit.  Tobacco use - Current smoker - Smokes inside her home - Ambivalent about quitting with past unsuccessful cessation attempts - Smoking occasionally bothers her non-smoking boyfriend  Visual acuity - Vision corrected to 20/20 with glasses - Requires new glasses  Immunization status - Received pneumonia vaccine in January - Up to date on tetanus and shingles vaccines - Plans to return for flu shot in the fall - Awaiting COVID vaccine  Lifestyle and dietary habits - Maintains an active lifestyle with outdoor activities such as mowing and raking - Does not consume alcohol excessively - Advised to reduce soda and juice intake - Prefers grilled foods over fried foods          Objective:    Today's Vitals   12/01/23 0815  BP: 122/68  Pulse: 78  Temp: 98 F (36.7 C)  SpO2: 98%  Weight: 123 lb (55.8 kg)  Height: 5' 5 (1.651 m)  PainSc: 0-No pain  Body mass index is 20.47 kg/m.  Medications Outpatient Encounter Medications as of 12/01/2023  Medication Sig   albuterol  (VENTOLIN  HFA) 108 (90 Base) MCG/ACT inhaler Inhale 2 puffs into the lungs every 6 (six) hours as needed for wheezing or shortness of breath.   aspirin  EC 81 MG tablet Take 81 mg by mouth daily. Swallow whole.   atorvastatin  (LIPITOR ) 80 MG tablet TAKE 1 TABLET(80 MG) BY MOUTH EVERY DAY   losartan  (COZAAR ) 100 MG tablet Take 1 tablet (100 mg total) by mouth  daily.   omeprazole  (PRILOSEC  OTC) 20 MG tablet Take 1 tablet (20 mg total) by mouth daily as needed (heartburn/indigestion).   valsartan -hydrochlorothiazide  (DIOVAN -HCT) 160-12.5 MG tablet TAKE 1 TABLET BY MOUTH EVERY DAY   No facility-administered encounter medications on file as of 12/01/2023.     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   Past Surgical History:  Procedure Laterality Date   CHOLECYSTECTOMY     2004   IR ANGIO INTRA EXTRACRAN SEL INTERNAL CAROTID BILAT MOD SED  10/14/2017   IR ANGIO INTRA EXTRACRAN SEL INTERNAL CAROTID BILAT MOD SED  03/06/2019   IR ANGIO INTRA EXTRACRAN SEL INTERNAL CAROTID UNI L MOD SED  12/09/2017   IR ANGIO INTRA EXTRACRAN SEL INTERNAL CAROTID UNI L MOD SED  06/02/2018   IR ANGIO VERTEBRAL SEL VERTEBRAL BILAT MOD SED  10/14/2017   IR ANGIO VERTEBRAL SEL VERTEBRAL UNI R MOD SED  03/06/2019   IR ANGIOGRAM FOLLOW UP STUDY  12/09/2017   IR TRANSCATH/EMBOLIZ  12/09/2017   IR US  GUIDE VASC ACCESS RIGHT  03/06/2019   RADIOLOGY WITH ANESTHESIA N/A 12/09/2017   Procedure: Embolization of  aneurysm;  Surgeon: Lanis Pupa, MD;  Location: [facility];  Service: Radiology;  Laterality: N/A;   TUBAL LIGATION     2005   WRIST SURGERY Left    2017- screws and plates    Family History  Problem Relation Age of Onset   Hypertension Mother    Heart attack Mother    Hypertension Father    Heart attack Father    Heart attack Brother    Hypertension Brother    Hyperlipidemia Brother    Heart failure Brother    Breast cancer Neg Hx    Social History   Occupational History   Not on file  Tobacco Use   Smoking status: Every Day    Current  packs/day: 1.50    Average packs/day: 1.5 packs/day for 42.0 years (63.0 ttl pk-yrs)    Types: Cigarettes   Smokeless tobacco: Never  Vaping Use   Vaping status: Never Used  Substance and Sexual Activity   Alcohol use: Yes    Alcohol/week: 2.0 standard drinks of alcohol    Types: 2 Cans of beer per week    Comment: socially   Drug use: Never   Sexual activity: Yes    Partners: Male    Tobacco Counseling Ready to quit: Not Answered Counseling given: Not Answered   Immunizations and Health Maintenance Immunization History  Administered Date(s) Administered   Influenza Inj Mdck Quad Pf 02/16/2021, 04/07/2022   Influenza-Unspecified 03/06/2023   Moderna Sars-Covid-2 Vaccination 10/31/2019, 05/06/2020   PNEUMOCOCCAL CONJUGATE-20 04/26/2023   Pfizer(Comirnaty)Fall Seasonal Vaccine 12 years and older 04/07/2022, 04/26/2023   Tdap 12/27/2011, 10/20/2022   Zoster Recombinant(Shingrix ) 10/20/2022, 12/23/2022   Health Maintenance Due  Topic Date Due   COVID-19 Vaccine (5 - 2024-25 season) 10/24/2023    Activities of Daily Living    12/01/2023    8:17 AM  In your present state of health, do you have any difficulty performing the following activities:  Hearing? 0  Vision? 0  Difficulty concentrating or making decisions? 0  Walking or climbing stairs? 0  Dressing or bathing? 0  Doing errands, shopping? 0  Preparing Food and eating ? N  Using the Toilet? N  In the past six months, have you accidently leaked urine? N  Do you have problems with loss of bowel control? N  Managing your Medications? N  Managing your Finances? N  Housekeeping or managing your Housekeeping? N    Physical Exam   Physical Exam Vitals reviewed.  Constitutional:      Appearance: Normal appearance. She is normal weight.  Neck:     Vascular: No carotid bruit.  Cardiovascular:     Rate and Rhythm: Normal rate and regular rhythm.     Heart sounds: Normal heart sounds.  Pulmonary:     Effort:  Pulmonary effort is normal. No respiratory distress.     Breath sounds: Normal breath sounds.  Abdominal:     General: Abdomen is flat. Bowel sounds are normal.     Palpations: Abdomen is soft.     Tenderness: There is no abdominal tenderness.  Neurological:     Mental Status: She is alert and oriented to person, place, and time.  Psychiatric:        Mood and Affect: Mood normal.        Behavior: Behavior normal.    (optional), or other factors deemed appropriate based on the beneficiary's medical and social history and current clinical standards.   Advanced Directives: Does Patient Have a Medical Advance  Directive?: Yes Type of Advance Directive: Healthcare Power of Attorney, Living will Does patient want to make changes to medical advance directive?: No - Patient declined Copy of Healthcare Power of Attorney in Chart?: No - copy requested  EKG:  normal EKG, normal sinus rhythm,       Assessment:    This is a routine wellness examination for this patient .         Vision/Hearing screen Vision Screening   Right eye Left eye Both eyes  Without correction     With correction 20/50 20/25 20/20      Goals      DIET - DECREASE SODA OR JUICE INTAKE        Depression Screen    12/01/2023    8:16 AM 06/07/2023    8:42 AM 04/26/2023    7:37 AM 10/20/2022    9:05 AM  PHQ 2/9 Scores  PHQ - 2 Score 2 0 0 0  PHQ- 9 Score 2 0 0 2     Fall Risk    12/01/2023    8:16 AM  Fall Risk   Falls in the past year? 0  Number falls in past yr: 0  Injury with Fall? 0  Risk for fall due to : No Fall Risks  Follow up Falls evaluation completed    Cognitive Function:        12/01/2023    8:19 AM  6CIT Screen  What Year? 0 points  What month? 0 points  What time? 0 points  Count back from 20 0 points  Months in reverse 0 points  Repeat phrase 0 points  Total Score 0 points    Patient Care Team: Sherre Clapper, MD as PCP - General (Family Medicine)     Plan:   Encounter for Medicare annual wellness exam Assessment & Plan: Things to do to keep yourself healthy  - Exercise at least 30-45 minutes a day, 3-4 days a week.  - Eat a low-fat diet with lots of fruits and vegetables, up to 7-9 servings per day.  - Seatbelts can save your life. Wear them always.  - Smoke detectors on every level of your home, check batteries every year.  - Eye Doctor - have an eye exam every 1-2 years  - Safe sex - if you may be exposed to STDs, use a condom.  - Alcohol -  If you drink, do it moderately, less than 2 drinks per day.  - Health Care Power of Attorney. Choose someone to speak for you if you are not able.  - Depression is common in our stressful world.If you're feeling down or losing interest in things you normally enjoy, please come in for a visit.  - Violence - If anyone is threatening or hurting you, please call immediately.   Orders: -     EKG 12-Lead  Nicotine dependence with current use Assessment & Plan: Patient continues to smoke approximately 1.5 packs per day. No desire to quit at this time.             Tobacco use Chronic tobacco use with ambivalence about quitting, acknowledges health risks. - Encouraged gradual reduction by removing one cigarette from the pack each week. - Suggested setting specific smoking times to limit frequency. - Encouraged alternative activities to reduce smoking urges. - Discussed potential use of cessation medications if ready to quit. - Advised smoking outdoors to reduce indoor exposure.  Impaired vision, needs new glasses Reports impaired vision, current vision is  20/20 with glasses when using both eyes. - Recommend obtaining new glasses prescription.  General Health Maintenance Up to date on tetanus and shingles vaccines. Discussed upcoming flu and COVID vaccines. Encouraged healthy lifestyle choices. - Schedule flu shot in September. - Recommend COVID vaccine when available. - Encourage healthy diet  and regular exercise, aiming for 30-45 minutes of activity five days a week.  Recording duration: 15 minutes   I have personally reviewed and noted the following in the patient's chart:   Medical and social history Use of alcohol, tobacco or illicit drugs  Current medications and supplements including opioid prescriptions. Patient is not currently taking opioid prescriptions. Functional ability and status Nutritional status Physical activity Advanced directives List of other physicians Hospitalizations, surgeries, and ER visits in previous 12 months Vitals Screenings to include cognitive, depression, and falls Referrals and appointments  In addition, I have reviewed and discussed with patient certain preventive protocols, quality metrics, and best practice recommendations. A written personalized care plan for preventive services as well as general preventive health recommendations were provided to patient.  LILLETTE Kato I Leal-Borjas,acting as a scribe for Abigail Free, MD.,have documented all relevant documentation on the behalf of Abigail Free, MD,as directed by  Abigail Free, MD while in the presence of Abigail Free, MD.   I attest that I have reviewed this visit and agree with the plan scribed by my staff.   Abigail Free, MD [facility] [phone]

## 2023-12-01 NOTE — Patient Instructions (Signed)

## 2023-12-04 DIAGNOSIS — Z0000 Encounter for general adult medical examination without abnormal findings: Secondary | ICD-10-CM | POA: Insufficient documentation

## 2023-12-04 NOTE — Assessment & Plan Note (Signed)

## 2023-12-04 NOTE — Assessment & Plan Note (Signed)
 Patient continues to smoke approximately 1.5 packs per day. No desire to quit at this time.

## 2024-01-05 ENCOUNTER — Ambulatory Visit (INDEPENDENT_AMBULATORY_CARE_PROVIDER_SITE_OTHER)

## 2024-01-05 DIAGNOSIS — Z23 Encounter for immunization: Secondary | ICD-10-CM | POA: Diagnosis not present

## 2024-01-27 ENCOUNTER — Other Ambulatory Visit: Payer: Self-pay | Admitting: Family Medicine

## 2024-02-23 ENCOUNTER — Ambulatory Visit: Admitting: Family Medicine

## 2024-02-23 ENCOUNTER — Encounter: Payer: Self-pay | Admitting: Family Medicine

## 2024-02-23 VITALS — BP 122/82 | HR 95 | Temp 98.3°F | Ht 65.0 in | Wt 121.0 lb

## 2024-02-23 DIAGNOSIS — J301 Allergic rhinitis due to pollen: Secondary | ICD-10-CM | POA: Diagnosis not present

## 2024-02-23 DIAGNOSIS — Z658 Other specified problems related to psychosocial circumstances: Secondary | ICD-10-CM

## 2024-02-23 DIAGNOSIS — R0981 Nasal congestion: Secondary | ICD-10-CM

## 2024-02-23 LAB — POC COVID19 BINAXNOW: SARS Coronavirus 2 Ag: NEGATIVE

## 2024-02-23 MED ORDER — AZELASTINE HCL 0.1 % NA SOLN: 2.0000 | Freq: Two times a day (BID) | NASAL | 1 refills | Status: DC

## 2024-02-23 NOTE — Progress Notes (Signed)
 Acute Office Visit  Subjective:    Patient ID: Michelle Montes, female    DOB: 06/06/1960, 63 y.o.   MRN: [id number]  Chief Complaint  Patient presents with   Sinusitis    Congested, runny nose    Discussed the use of AI scribe software for clinical note transcription with the patient, who gave verbal consent to proceed.  History of Present Illness Michelle Montes is a 63 year old female who presents with sinus congestion and runny nose.  Upper respiratory symptoms - Sinus congestion and rhinorrhea for two days - Sinus drainage attributed to recent outdoor yard work - Mild cough present - No shortness of breath, chest pain, or fever - Epistaxis noted yesterday after using a neti pot - No prior use of nasal sprays - No known exposure to COVID-19  Allergy management - Daily use of Zyrtec, Allegra, and [employer] brand allergy medication with perceived benefit - Use of Mucinex DM for symptom relief  Immunization status - Received influenza vaccination - Has not received updated COVID-19 vaccine  Psychological stress and sleep disturbance - Significant stress related to ongoing civil lawsuit involving dog bite incident - Measures taken to control dog include use of shock collar and fenced area - Additional family stress due to stepfather's living situation and accusations of elder neglect from his children - Recent bereavement following great-nephew's death in Aug 18, 2023 from car accident - Poor sleep quality and early morning awakening attributed to stress - Denies need for antidepressant    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    Past Surgical History:  Procedure  Laterality Date   CHOLECYSTECTOMY     2004   IR ANGIO INTRA EXTRACRAN SEL INTERNAL CAROTID BILAT MOD SED  10/14/2017   IR ANGIO INTRA EXTRACRAN SEL INTERNAL CAROTID BILAT MOD SED  03/06/2019   IR ANGIO INTRA EXTRACRAN SEL INTERNAL CAROTID UNI L MOD SED  12/09/2017   IR ANGIO INTRA EXTRACRAN SEL INTERNAL CAROTID UNI L MOD SED  06/02/2018   IR ANGIO VERTEBRAL SEL VERTEBRAL BILAT MOD SED  10/14/2017   IR ANGIO VERTEBRAL SEL VERTEBRAL UNI R MOD SED  03/06/2019   IR ANGIOGRAM FOLLOW UP STUDY  12/09/2017   IR TRANSCATH/EMBOLIZ  12/09/2017   IR US  GUIDE VASC ACCESS RIGHT  03/06/2019   RADIOLOGY WITH ANESTHESIA N/A 12/09/2017   Procedure: Embolization of aneurysm;  Surgeon: Lanis Pupa, MD;  Location: [facility] OR;  Service: Radiology;  Laterality: N/A;   TUBAL LIGATION     2005   WRIST SURGERY Left    2017- screws and plates    Family History  Problem Relation Age of Onset   Hypertension Mother    Heart attack Mother    Hypertension Father    Heart attack Father    Heart attack Brother    Hypertension Brother    Hyperlipidemia Brother    Heart failure Brother    Breast cancer Neg Hx     Social History   Socioeconomic History   Marital status: Significant Other    Spouse name: Not on file   Number of children: 0   Years of education: Not on file  Highest education level: Not on file  Occupational History   Not on file  Tobacco Use   Smoking status: Every Day    Current packs/day: 1.50    Average packs/day: 1.5 packs/day for 42.0 years (63.0 ttl pk-yrs)    Types: Cigarettes   Smokeless tobacco: Never  Vaping Use   Vaping status: Never Used  Substance and Sexual Activity   Alcohol use: Yes    Alcohol/week: 2.0 standard drinks of alcohol    Types: 2 Cans of beer per week    Comment: socially   Drug use: Never   Sexual activity: Yes    Partners: Male  Other Topics Concern   Not on file  Social History Narrative   Not on file   Social Drivers of Health   Financial  Resource Strain: Low Risk  (12/01/2023)   Overall Financial Resource Strain (CARDIA)    Difficulty of Paying Living Expenses: Not hard at all  Food Insecurity: No Food Insecurity (12/01/2023)   Hunger Vital Sign    Worried About Running Out of Food in the Last Year: Never true    Ran Out of Food in the Last Year: Never true  Transportation Needs: No Transportation Needs (12/01/2023)   PRAPARE - Administrator, Civil Service (Medical): No    Lack of Transportation (Non-Medical): No  Physical Activity: Insufficiently Active (12/01/2023)   Exercise Vital Sign    Days of Exercise per Week: 2 days    Minutes of Exercise per Session: 60 min  Stress: No Stress Concern Present (12/01/2023)   [name] of Occupational Health - Occupational Stress Questionnaire    Feeling of Stress: Not at all  Social Connections: Socially Isolated (12/01/2023)   Social Connection and Isolation Panel    Frequency of Communication with Friends and Family: More than three times a week    Frequency of Social Gatherings with Friends and Family: More than three times a week    Attends Religious Services: Never    Database Administrator or Organizations: No    Attends Banker Meetings: Never    Marital Status: Divorced  Catering Manager Violence: Not At Risk (12/01/2023)   Humiliation, Afraid, Rape, and Kick questionnaire    Fear of Current or Ex-Partner: No    Emotionally Abused: No    Physically Abused: No    Sexually Abused: No    Outpatient Medications Prior to Visit  Medication Sig Dispense Refill   albuterol  (VENTOLIN  HFA) 108 (90 Base) MCG/ACT inhaler Inhale 2 puffs into the lungs every 6 (six) hours as needed for wheezing or shortness of breath. 8 g 2   aspirin  EC 81 MG tablet Take 81 mg by mouth daily. Swallow whole.     atorvastatin  (LIPITOR ) 80 MG tablet TAKE 1 TABLET(80 MG) BY MOUTH EVERY DAY 90 tablet 3   losartan  (COZAAR ) 100 MG tablet Take 1 tablet (100 mg total) by  mouth daily. 90 tablet 3   omeprazole  (PRILOSEC  OTC) 20 MG tablet Take 1 tablet (20 mg total) by mouth daily as needed (heartburn/indigestion). 90 tablet 2   valsartan -hydrochlorothiazide  (DIOVAN -HCT) 160-12.5 MG tablet TAKE 1 TABLET BY MOUTH EVERY DAY 90 tablet 0   No facility-administered medications prior to visit.    No Known Allergies  Review of Systems  All other systems reviewed and are negative.      Objective:        02/23/2024    2:01 PM 12/01/2023    8:15 AM  06/07/2023    8:36 AM  Vitals with BMI  Height 5' 5 5' 5 5' 5  Weight 121 lbs 123 lbs 119 lbs 10 oz  BMI 20.14 20.47 19.9  Systolic 122 122 861  Diastolic 82 68 72  Pulse 95 78 91    No data found.   Physical Exam Vitals reviewed.  Constitutional:      Appearance: Normal appearance. She is normal weight.  HENT:     Right Ear: Tympanic membrane, ear canal and external ear normal.     Left Ear: Tympanic membrane and external ear normal.     Nose: Congestion and rhinorrhea present.     Comments: Some scabs in nose    Mouth/Throat:     Pharynx: Oropharynx is clear.  Cardiovascular:     Rate and Rhythm: Normal rate and regular rhythm.     Pulses: Normal pulses.     Heart sounds: Normal heart sounds. No murmur heard. Pulmonary:     Effort: Pulmonary effort is normal. No respiratory distress.     Breath sounds: Normal breath sounds.  Neurological:     Mental Status: She is alert and oriented to person, place, and time.  Psychiatric:        Behavior: Behavior normal.     Comments: tearful     Health Maintenance Due  Topic Date Due   COVID-19 Vaccine (5 - 2025-26 season) 12/12/2023   Mammogram  04/21/2024    There are no preventive care reminders to display for this patient.   Lab Results  Component Value Date   TSH 0.718 10/20/2022   Lab Results  Component Value Date   WBC 6.6 04/26/2023   HGB 15.0 04/26/2023   HCT 44.7 04/26/2023   MCV 85 04/26/2023   PLT 205 04/26/2023   Lab  Results  Component Value Date   NA 146 (H) 04/26/2023   K 4.8 04/26/2023   CO2 22 04/26/2023   GLUCOSE 93 04/26/2023   BUN 9 04/26/2023   CREATININE 0.50 (L) 04/26/2023   BILITOT 0.6 04/26/2023   ALKPHOS 139 (H) 04/26/2023   AST 24 04/26/2023   ALT 24 04/26/2023   PROT 6.8 04/26/2023   ALBUMIN 4.8 04/26/2023   CALCIUM  9.8 04/26/2023   ANIONGAP 7 03/06/2019   EGFR 105 04/26/2023   Lab Results  Component Value Date   CHOL 156 04/26/2023   Lab Results  Component Value Date   HDL 66 04/26/2023   Lab Results  Component Value Date   LDLCALC 71 04/26/2023   Lab Results  Component Value Date   TRIG 109 04/26/2023   Lab Results  Component Value Date   CHOLHDL 2.4 04/26/2023   No results found for: HGBA1C      Results for orders placed or performed in visit on 02/23/24  POC COVID-19 BinaxNow   Collection Time: 02/23/24  2:28 PM  Result Value Ref Range   SARS Coronavirus 2 Ag Negative Negative     Assessment & Plan:   Assessment & Plan Seasonal allergic rhinitis due to pollen Nasal congestion with rhinorrhea and postnasal drainage Symptoms include nasal irritation and minor epistaxis. COVID-19 test negative. Current medications provide partial relief. - Prescribed nasal spray (azelastine) for allergy relief. - Continue Zyrtec or Allegra, and Mucinex DM. - Continue using neti pot. Orders:   POC COVID-19 BinaxNow  Psychosocial distress Encouragement given.       Body mass index is 20.14 kg/m..   Meds ordered this encounter  Medications  azelastine (ASTELIN) 0.1 % nasal spray    Sig: Place 2 sprays into both nostrils 2 (two) times daily. Use in each nostril as directed    Dispense:  30 mL    Refill:  1    Orders Placed This Encounter  Procedures   POC COVID-19 BinaxNow    Total time spent on today's visit was 30 minutes, including both face-to-face time and nonface-to-face time personally spent on review of chart (labs and imaging), discussing  labs and goals, discussing further work-up, treatment options, referrals to specialist if needed, reviewing outside records of pertinent, answering patient's questions, and coordinating care.  LILLETTE Kato I Leal-Borjas,acting as a scribe for Abigail Free, MD.,have documented all relevant documentation on the behalf of Abigail Free, MD,as directed by  Abigail Free, MD while in the presence of Abigail Free, MD.   Follow-up: Return if symptoms worsen or fail to improve.  An After Visit Summary was printed and given to the patient.  I attest that I have reviewed this visit and agree with the plan scribed by my staff.   Abigail Free, MD [facility] [phone]

## 2024-02-24 ENCOUNTER — Ambulatory Visit: Admitting: Family Medicine

## 2024-02-26 DIAGNOSIS — Z658 Other specified problems related to psychosocial circumstances: Secondary | ICD-10-CM | POA: Insufficient documentation

## 2024-02-26 DIAGNOSIS — J301 Allergic rhinitis due to pollen: Secondary | ICD-10-CM | POA: Insufficient documentation

## 2024-02-26 NOTE — Assessment & Plan Note (Signed)
Encouragement given 

## 2024-02-26 NOTE — Assessment & Plan Note (Addendum)
 Nasal congestion with rhinorrhea and postnasal drainage Symptoms include nasal irritation and minor epistaxis. COVID-19 test negative. Current medications provide partial relief. - Prescribed nasal spray (azelastine) for allergy relief. - Continue Zyrtec or Allegra, and Mucinex DM. - Continue using neti pot. Orders:   POC COVID-19 BinaxNow

## 2024-03-16 ENCOUNTER — Other Ambulatory Visit: Payer: Self-pay | Admitting: Family Medicine

## 2024-03-31 ENCOUNTER — Other Ambulatory Visit: Payer: Self-pay | Admitting: Family Medicine

## 2024-03-31 DIAGNOSIS — E782 Mixed hyperlipidemia: Secondary | ICD-10-CM

## 2024-04-02 ENCOUNTER — Other Ambulatory Visit: Payer: Self-pay | Admitting: Family Medicine

## 2024-04-13 ENCOUNTER — Other Ambulatory Visit: Payer: Self-pay

## 2024-04-13 MED ORDER — VALSARTAN-HYDROCHLOROTHIAZIDE 160-12.5 MG PO TABS: 1.0000 | ORAL_TABLET | Freq: Every day | ORAL | 0 refills | Status: AC

## 2024-04-23 ENCOUNTER — Ambulatory Visit: Payer: Self-pay

## 2024-04-23 NOTE — Telephone Encounter (Signed)
 FYI Only or Action Required?: FYI only for provider: appointment scheduled on 01.15.26.  Patient was last seen in primary care on 02/23/2024 by Sherre Clapper, MD.  Called Nurse Triage reporting Altered Mental Status.  Symptoms began several years ago.  Interventions attempted: Nothing.  Symptoms are: gradually worsening.  Triage Disposition: See HCP Within 4 Hours (Or PCP Triage)  Patient/caregiver understands and will follow disposition?: Yes   Copied from CRM #[id number]. Topic: Clinical - Red Word Triage >> Apr 23, 2024  4:13 PM Alfonso ORN wrote: Red Word that prompted transfer to Nurse Triage:pt sister said pt is out of character, belligerent ,hyper fixations, family is concerned Reason for Disposition  [1] Longstanding confusion (e.g., dementia, stroke) AND [2] getting worse  Answer Assessment - Initial Assessment Questions 1. LEVEL OF CONSCIOUSNESS: How are they (the patient) acting right now? (e.g., alert-oriented, confused, lethargic, stuporous, comatose)     Pt's sister states pt sits in her neighbor's yard at 2-3 in the morning. Pt is wearing dirty clothes for three days in a row. Pt's sister states patient is mean and not herself.  2. ONSET: When did the confusion start?  (e.g., minutes, hours, days)     X 1 year  3. PATTERN: Does this come and go, or has it been constant since it started?  Is it present now?      Comes and goes  4. ALCOHOL or DRUGS: Have they been drinking alcohol or taking any drugs?       Marijuana  5. NARCOTIC MEDICINES: Have they been receiving any narcotic medications? (e.g., morphine , Vicodin)      denies  6. CAUSE: What do you think is causing the confusion?       Unsure  7. OTHER SYMPTOMS:Pt's sister denies difficulty breathing, fever, headache, weakness        Pt's sister reports Confusion and odd behavior Pt scheduled for an in clinic visit on 01.15.26. ED precautions given Pt agrees with plan of care, will call back for  any worsening symptoms  Protocols used: Confusion - Delirium-A-AH

## 2024-04-26 ENCOUNTER — Ambulatory Visit: Payer: Medicare (Managed Care) | Admitting: Family Medicine

## 2024-04-26 ENCOUNTER — Telehealth: Payer: Self-pay

## 2024-04-26 VITALS — BP 160/84 | HR 99 | Temp 98.3°F | Ht 65.0 in | Wt 116.0 lb

## 2024-04-26 DIAGNOSIS — Z658 Other specified problems related to psychosocial circumstances: Secondary | ICD-10-CM

## 2024-04-26 DIAGNOSIS — I10 Essential (primary) hypertension: Secondary | ICD-10-CM | POA: Diagnosis not present

## 2024-04-26 DIAGNOSIS — Z8679 Personal history of other diseases of the circulatory system: Secondary | ICD-10-CM | POA: Diagnosis not present

## 2024-04-26 DIAGNOSIS — Z1231 Encounter for screening mammogram for malignant neoplasm of breast: Secondary | ICD-10-CM

## 2024-04-26 DIAGNOSIS — F688 Other specified disorders of adult personality and behavior: Secondary | ICD-10-CM

## 2024-04-26 DIAGNOSIS — I69354 Hemiplegia and hemiparesis following cerebral infarction affecting left non-dominant side: Secondary | ICD-10-CM | POA: Diagnosis not present

## 2024-04-26 DIAGNOSIS — Z23 Encounter for immunization: Secondary | ICD-10-CM

## 2024-04-26 DIAGNOSIS — E782 Mixed hyperlipidemia: Secondary | ICD-10-CM

## 2024-04-26 LAB — TSH: TSH: 1.08 u[IU]/mL (ref 0.450–4.500)

## 2024-04-26 LAB — COMPREHENSIVE METABOLIC PANEL WITH GFR
ALT: 27 IU/L (ref 0–32)
AST: 20 IU/L (ref 0–40)
Albumin: 4.8 g/dL (ref 3.9–4.9)
Alkaline Phosphatase: 121 IU/L (ref 49–135)
BUN/Creatinine Ratio: 13 (ref 12–28)
BUN: 9 mg/dL (ref 8–27)
Bilirubin Total: 0.6 mg/dL (ref 0.0–1.2)
CO2: 26 mmol/L (ref 20–29)
Calcium: 10.1 mg/dL (ref 8.7–10.3)
Chloride: 102 mmol/L (ref 96–106)
Creatinine, Ser: 0.68 mg/dL (ref 0.57–1.00)
Globulin, Total: 2.2 g/dL (ref 1.5–4.5)
Glucose: 115 mg/dL — ABNORMAL HIGH (ref 70–99)
Potassium: 4.7 mmol/L (ref 3.5–5.2)
Sodium: 144 mmol/L (ref 134–144)
Total Protein: 7 g/dL (ref 6.0–8.5)
eGFR: 97 mL/min/1.73

## 2024-04-26 LAB — CBC WITH DIFFERENTIAL/PLATELET
Basophils Absolute: 0 x10E3/uL (ref 0.0–0.2)
Basos: 1 %
EOS (ABSOLUTE): 0.1 x10E3/uL (ref 0.0–0.4)
Eos: 1 %
Hematocrit: 44.8 % (ref 34.0–46.6)
Hemoglobin: 14.7 g/dL (ref 11.1–15.9)
Immature Grans (Abs): 0 x10E3/uL (ref 0.0–0.1)
Immature Granulocytes: 0 %
Lymphocytes Absolute: 1.9 x10E3/uL (ref 0.7–3.1)
Lymphs: 33 %
MCH: 28.8 pg (ref 26.6–33.0)
MCHC: 32.8 g/dL (ref 31.5–35.7)
MCV: 88 fL (ref 79–97)
Monocytes Absolute: 0.4 x10E3/uL (ref 0.1–0.9)
Monocytes: 7 %
Neutrophils Absolute: 3.5 x10E3/uL (ref 1.4–7.0)
Neutrophils: 58 %
Platelets: 242 x10E3/uL (ref 150–450)
RBC: 5.11 x10E6/uL (ref 3.77–5.28)
RDW: 13.2 % (ref 11.7–15.4)
WBC: 5.9 x10E3/uL (ref 3.4–10.8)

## 2024-04-26 LAB — POCT LIPID PANEL
HDL: 27
LDL: 100
Non-HDL: 116
TC: 142
TRG: 79

## 2024-04-26 MED ORDER — EZETIMIBE 10 MG PO TABS: 10.0000 mg | ORAL_TABLET | Freq: Every day | ORAL | 3 refills | Status: AC

## 2024-04-26 NOTE — Progress Notes (Signed)
 "  Subjective:  Patient ID: Michelle Montes, female    DOB: Aug 10, 1960  Age: 64 y.o. MRN: [id number]  Chief Complaint  Patient presents with   Medical Management of Chronic Issues   HPI: NIECE AND SISTER ARE CONCERNED ABOUT HER. QUESTIONS ABOUT LIVING WILL. NIECE, MICHELLE HAITHCOCK, IS HER HCPOA PER PATIENT.   Hypertension: start on valsartan -hydrochlorothiazide  160/12.5 mg once daily.  GERD: on omeprazole  20 mg daily as needed.  Hyperlipidemia: on atorvastatin  80 mg before bed and zetia  10 mg daily.   Hemiparesis left nondominant side. Patient had a brain aneurysm that was treated by Dr. Lanis with a stent (Surpass stent, mri safe, normal mode only. 1.5 or 3T 2w/kg 720-gauss/cm is information the patient has on the stent.) Patient is overdue for MRI/MRA and for neurosurgery follow up, I found out after discussing with Dr. Lanis. Patient was not sure when or if she needed repeat imaging or a follow up.   Patient has checked on counseling. Considering it still, but won't commit. Does not want any medicine for anxiety/depression.  Boyfriend is concerned about her balance. Often wears nonsupportive shoes.  Becomes forgetful at times. Left side is weak. No falls. Has not had physical therapy since just after last stroke (2019.) No change to weakness. No slurred speech.  Not herself. She is more confrontational. Patient lived beside her mom and stepfather. Mom passed away. Stepdad moved out and took a runner, broadcasting/film/video. Per family, patient is very easily angered and her personality has changed in the last year.       12/01/2023    8:16 AM 06/07/2023    8:42 AM 04/26/2023    7:37 AM 10/20/2022    9:05 AM 04/07/2022    7:37 AM  Depression screen PHQ 2/9  Decreased Interest 1 0 0 0 0  Down, Depressed, Hopeless 1 0 0 0 0  PHQ - 2 Score 2 0 0 0 0  Altered sleeping 0 0 0 2   Tired, decreased energy 0 0 0 0   Change in appetite 0 0 0 0   Feeling bad or failure about yourself  0 0 0 0    Trouble concentrating 0 0 0 0   Moving slowly or fidgety/restless 0 0 0 0   Suicidal thoughts 0 0 0 0   PHQ-9 Score 2  0  0  2    Difficult doing work/chores Not difficult at all Not difficult at all Not difficult at all Not difficult at all      Data saved with a previous flowsheet row definition        12/01/2023    8:16 AM  Fall Risk   Falls in the past year? 0  Number falls in past yr: 0  Injury with Fall? 0   Risk for fall due to : No Fall Risks  Follow up Falls evaluation completed     Data saved with a previous flowsheet row definition    Patient Care Team: Sherre Clapper, MD as PCP - General (Family Medicine)   Review of Systems  Constitutional:  Negative for chills, fatigue and fever.  HENT:  Negative for congestion, ear pain and sore throat.        LEFT EAR HAD LITTLE BLOOD IN EAR AND WOULD LIKE IT CHECKED. OCCURRED ON 1/6.   Respiratory:  Negative for cough and shortness of breath.   Cardiovascular:  Negative for chest pain.  Gastrointestinal:  Negative for abdominal pain, constipation, diarrhea, nausea and  vomiting.  Genitourinary:  Negative for dysuria and urgency.  Musculoskeletal:  Negative for arthralgias and myalgias.  Skin:  Negative for rash.  Neurological:  Negative for dizziness and headaches.  Psychiatric/Behavioral:  Positive for agitation and behavioral problems. Negative for dysphoric mood. The patient is not nervous/anxious.     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   Past Surgical History:  Procedure Laterality Date   CHOLECYSTECTOMY     2004   IR ANGIO INTRA EXTRACRAN SEL INTERNAL CAROTID  BILAT MOD SED  10/14/2017   IR ANGIO INTRA EXTRACRAN SEL INTERNAL CAROTID BILAT MOD SED  03/06/2019   IR ANGIO INTRA EXTRACRAN SEL INTERNAL CAROTID UNI L MOD SED  12/09/2017   IR ANGIO INTRA EXTRACRAN SEL INTERNAL CAROTID UNI L MOD SED  06/02/2018   IR ANGIO VERTEBRAL SEL VERTEBRAL BILAT MOD SED  10/14/2017   IR ANGIO VERTEBRAL SEL VERTEBRAL UNI R MOD SED  03/06/2019   IR ANGIOGRAM FOLLOW UP STUDY  12/09/2017   IR TRANSCATH/EMBOLIZ  12/09/2017   IR US  GUIDE VASC ACCESS RIGHT  03/06/2019   RADIOLOGY WITH ANESTHESIA N/A 12/09/2017   Procedure: Embolization of aneurysm;  Surgeon: Lanis Pupa, MD;  Location: [facility] OR;  Service: Radiology;  Laterality: N/A;   TUBAL LIGATION     2005   WRIST SURGERY Left    2017- screws and plates    Family History  Problem Relation Age of Onset   Hypertension Mother    Heart attack Mother    Hypertension Father    Heart attack Father    Heart attack Brother    Hypertension Brother    Hyperlipidemia Brother    Heart failure Brother    Breast cancer Neg Hx    Social History   Socioeconomic History   Marital status: Significant Other    Spouse name: Not on file   Number of children: 0   Years of education: Not on file   Highest education level: Not on file  Occupational History   Not on file  Tobacco Use   Smoking status: Every Day    Current packs/day: 1.50    Average packs/day: 1.5 packs/day for 42.0 years (63.0 ttl pk-yrs)    Types: Cigarettes   Smokeless tobacco: Never  Vaping Use   Vaping status: Never Used  Substance and Sexual Activity   Alcohol use: Yes    Alcohol/week: 2.0 standard drinks of alcohol    Types: 2 Cans of beer per week    Comment: socially   Drug use: Never   Sexual activity: Yes    Partners: Male  Other Topics Concern   Not on file  Social History Narrative   Not on file   Social Drivers of Health   Tobacco Use: High Risk (04/30/2024)   Patient History    Smoking Tobacco Use: Every Day    Smokeless Tobacco  Use: Never    Passive Exposure: Not on file  Financial Resource Strain: Low Risk (12/01/2023)   Overall Financial Resource Strain (CARDIA)    Difficulty of Paying Living Expenses: Not hard at all  Food Insecurity: No Food Insecurity (04/26/2024)  Epic    Worried About Programme Researcher, Broadcasting/film/video in the Last Year: Never true    [employer] of Food in the Last Year: Never true  Transportation Needs: No Transportation Needs (04/26/2024)   Epic    Lack of Transportation (Medical): No    Lack of Transportation (Non-Medical): No  Physical Activity: Insufficiently Active (12/01/2023)   Exercise Vital Sign    Days of Exercise per Week: 2 days    Minutes of Exercise per Session: 60 min  Stress: No Stress Concern Present (12/01/2023)   [name] of Occupational Health - Occupational Stress Questionnaire    Feeling of Stress: Not at all  Social Connections: Socially Isolated (12/01/2023)   Social Connection and Isolation Panel    Frequency of Communication with Friends and Family: More than three times a week    Frequency of Social Gatherings with Friends and Family: More than three times a week    Attends Religious Services: Never    Database Administrator or Organizations: No    Attends Banker Meetings: Never    Marital Status: Divorced  Depression (PHQ2-9): Low Risk (12/01/2023)   Depression (PHQ2-9)    PHQ-2 Score: 2  Alcohol Screen: Low Risk (12/01/2023)   Alcohol Screen    Last Alcohol Screening Score (AUDIT): 1  Housing: Low Risk (04/26/2024)   Epic    Unable to Pay for Housing in the Last Year: No    Number of Times Moved in the Last Year: 0    Homeless in the Last Year: No  Utilities: Not At Risk (04/26/2024)   Epic    Threatened with loss of utilities: No  Health Literacy: Adequate Health Literacy (12/01/2023)   B1300 Health Literacy    Frequency of need for help with medical instructions: Never    Objective:  BP (!) 160/84   Pulse 99   Temp 98.3 F (36.8 C)   Ht 5'  5 (1.651 m)   Wt 116 lb (52.6 kg)   SpO2 97%   BMI 19.30 kg/m      04/26/2024    8:44 AM 04/26/2024    7:43 AM 02/23/2024    2:01 PM  BP/Weight  Systolic BP 160 136 122  Diastolic BP 84 80 82  Wt. (Lbs)  116 121  BMI  19.3 kg/m2 20.14 kg/m2    Physical Exam Vitals reviewed.  Constitutional:      Appearance: Normal appearance. She is normal weight.  Neck:     Vascular: No carotid bruit.  Cardiovascular:     Rate and Rhythm: Normal rate and regular rhythm.     Heart sounds: Normal heart sounds.  Pulmonary:     Effort: Pulmonary effort is normal. No respiratory distress.     Breath sounds: Normal breath sounds.  Abdominal:     General: Abdomen is flat. Bowel sounds are normal.     Palpations: Abdomen is soft.     Tenderness: There is no abdominal tenderness.  Neurological:     Mental Status: She is alert and oriented to person, place, and time.     Cranial Nerves: No cranial nerve deficit.  Psychiatric:        Mood and Affect: Mood normal.        Behavior: Behavior normal.       04/26/2024    8:45 AM  MMSE - Mini Mental State Exam  Orientation to time 5  Orientation to Place 5  Registration 3  Registration-comments [place], Daughter,  [place]  Attention/ Calculation 5  Attention/Calculation-comments [place]  Recall 1  Recall-comments [employer]- name 2 objects 1  Language- repeat 1  Language- follow 3 step command 3  Language- read & follow direction 1  Write a sentence 1  Copy design 1  Total score 27         Lab Results  Component Value Date   WBC 5.9 04/26/2024   HGB 14.7 04/26/2024   HCT 44.8 04/26/2024   PLT 242 04/26/2024   GLUCOSE 115 (H) 04/26/2024   CHOL 156 04/26/2023   TRIG 109 04/26/2023   HDL 66 04/26/2023   LDLCALC 71 04/26/2023   ALT 27 04/26/2024   AST 20 04/26/2024   NA 144 04/26/2024   K 4.7 04/26/2024   CL 102 04/26/2024   CREATININE 0.68 04/26/2024   BUN 9 04/26/2024   CO2 26 04/26/2024   TSH 1.080 04/26/2024    INR 0.9 03/06/2019    Results for orders placed or performed in visit on 04/26/24  POCT Lipid Panel   Collection Time: 04/26/24  8:02 AM  Result Value Ref Range   TC 142    HDL 27    TRG 79    LDL 100    Non-HDL 116    TC/HDL    CBC with Differential/Platelet   Collection Time: 04/26/24  9:08 AM  Result Value Ref Range   WBC 5.9 3.4 - 10.8 x10E3/uL   RBC 5.11 3.77 - 5.28 x10E6/uL   Hemoglobin 14.7 11.1 - 15.9 g/dL   Hematocrit 55.1 65.9 - 46.6 %   MCV 88 79 - 97 fL   MCH 28.8 26.6 - 33.0 pg   MCHC 32.8 31.5 - 35.7 g/dL   RDW 86.7 88.2 - 84.5 %   Platelets 242 150 - 450 x10E3/uL   Neutrophils 58 Not Estab. %   Lymphs 33 Not Estab. %   Monocytes 7 Not Estab. %   Eos 1 Not Estab. %   Basos 1 Not Estab. %   Neutrophils Absolute 3.5 1.4 - 7.0 x10E3/uL   Lymphocytes Absolute 1.9 0.7 - 3.1 x10E3/uL   Monocytes Absolute 0.4 0.1 - 0.9 x10E3/uL   EOS (ABSOLUTE) 0.1 0.0 - 0.4 x10E3/uL   Basophils Absolute 0.0 0.0 - 0.2 x10E3/uL   Immature Granulocytes 0 Not Estab. %   Immature Grans (Abs) 0.0 0.0 - 0.1 x10E3/uL  Comprehensive metabolic panel with GFR   Collection Time: 04/26/24  9:08 AM  Result Value Ref Range   Glucose 115 (H) 70 - 99 mg/dL   BUN 9 8 - 27 mg/dL   Creatinine, Ser 9.31 0.57 - 1.00 mg/dL   eGFR 97 >40 fO/fpw/8.26   BUN/Creatinine Ratio 13 12 - 28   Sodium 144 134 - 144 mmol/L   Potassium 4.7 3.5 - 5.2 mmol/L   Chloride 102 96 - 106 mmol/L   CO2 26 20 - 29 mmol/L   Calcium  10.1 8.7 - 10.3 mg/dL   Total Protein 7.0 6.0 - 8.5 g/dL   Albumin 4.8 3.9 - 4.9 g/dL   Globulin, Total 2.2 1.5 - 4.5 g/dL   Bilirubin Total 0.6 0.0 - 1.2 mg/dL   Alkaline Phosphatase 121 49 - 135 IU/L   AST 20 0 - 40 IU/L   ALT 27 0 - 32 IU/L  TSH   Collection Time: 04/26/24  9:09 AM  Result Value Ref Range   TSH 1.080 0.450 - 4.500 uIU/mL  .  Assessment & Plan:   Assessment &  Plan Encounter for screening mammogram for malignant neoplasm of breast Ordered Orders:   MM 3D  SCREENING MAMMOGRAM BILATERAL BREAST; Future  Mixed hyperlipidemia Controlled Continue taking Lipitor  80mg  daily and zetia  10 mg daily.  Continue to eat healthy and exercise.  Orders:   POCT Lipid Panel   Essential (primary) hypertension Not at goal, but patient is very upset today.  Recheck at next visit in 1 week when comes for labs. .  Patient says bp is good at home.  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  Recommend MRI/MRA of brain.  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  Refer back to neurosurgery    Psychosocial distress Refuses medicine. Defers counseling for now.  Work up for brain etiology.     History of cerebral aneurysm MRA of brain ordered.  Referred back to neurosurgery.       Body mass index is 19.3 kg/m.    Meds ordered this encounter  Medications   ezetimibe  (ZETIA ) 10 MG tablet    Sig: Take 1 tablet (10 mg total) by mouth daily.    Dispense:  90 tablet    Refill:  3    Orders Placed This Encounter  Procedures   MM 3D SCREENING MAMMOGRAM BILATERAL BREAST   Pfizer Comirnaty Covid-19 Vaccine 27yrs & older   CBC with Differential/Platelet   Comprehensive metabolic panel with GFR   TSH   POCT Lipid Panel     Total time spent on today's visit was 50 minutes, including both face-to-face time and nonface-to-face time personally spent on review of chart (labs and imaging), discussing labs and goals, discussing further work-up, treatment options, referrals to specialist if needed, reviewing outside  records of pertinent, answering patient's questions, and coordinating care.     Follow-up: Return in about 1 week (around 05/03/2024) for lab visit, BP CHECK.  An After Visit Summary was printed and given to the patient.  Abigail Free, MD [facility] [phone]     [1]  Current Outpatient Medications on File Prior to Visit  Medication Sig Dispense Refill   albuterol  (VENTOLIN  HFA) 108 (90 Base) MCG/ACT inhaler Inhale 2 puffs into the lungs every 6 (six) hours as needed for wheezing or shortness of breath. 8 g 2   aspirin  EC 81 MG tablet Take 81 mg by mouth daily. Swallow whole.     atorvastatin  (LIPITOR ) 80 MG tablet TAKE 1 TABLET(80 MG) BY MOUTH EVERY DAY 90 tablet 0   Azelastine  HCl 137 MCG/SPRAY SOLN PLACE 2 SPRAYS INTO BOTH NOSTRILS 2 (TWO) TIMES DAILY. USE IN EACH NOSTRIL AS DIRECTED 90 mL 3   omeprazole  (PRILOSEC  OTC) 20 MG tablet Take 1 tablet (20 mg total) by mouth daily as needed (heartburn/indigestion). 90 tablet 2   valsartan -hydrochlorothiazide  (DIOVAN -HCT) 160-12.5 MG tablet Take 1 tablet by mouth daily. 90 tablet 0   No current facility-administered medications on file prior to visit.   "

## 2024-04-26 NOTE — Telephone Encounter (Signed)
 Patients sister left contact info:  Darice (Sister) [phone]  Rosaline (Healthcare POA) (this is also Karen's daughter/patients niece) [phone]

## 2024-04-26 NOTE — Assessment & Plan Note (Addendum)
 Minimally effects life, but now is having change in personality and at times is confused.  Recommend MRI/MRA of brain.  Refer back to neurosurgery

## 2024-04-26 NOTE — Assessment & Plan Note (Addendum)
 Not at goal, but patient is very upset today.  Recheck at next visit in 1 week when comes for labs. .  Patient says bp is good at home.  Orders:   CBC with Differential/Platelet   Comprehensive metabolic panel with GFR   TSH

## 2024-04-26 NOTE — Assessment & Plan Note (Addendum)
 Controlled Continue taking Lipitor  80mg  daily and zetia  10 mg daily.  Continue to eat healthy and exercise.  Orders:   POCT Lipid Panel

## 2024-04-28 ENCOUNTER — Ambulatory Visit: Payer: Self-pay | Admitting: Family Medicine

## 2024-04-30 ENCOUNTER — Telehealth: Payer: Self-pay

## 2024-04-30 ENCOUNTER — Ambulatory Visit: Payer: Self-pay

## 2024-04-30 ENCOUNTER — Encounter: Payer: Self-pay | Admitting: Family Medicine

## 2024-04-30 ENCOUNTER — Other Ambulatory Visit: Payer: Self-pay | Admitting: Family Medicine

## 2024-04-30 DIAGNOSIS — Z1231 Encounter for screening mammogram for malignant neoplasm of breast: Secondary | ICD-10-CM | POA: Insufficient documentation

## 2024-04-30 DIAGNOSIS — Z8679 Personal history of other diseases of the circulatory system: Secondary | ICD-10-CM | POA: Insufficient documentation

## 2024-04-30 DIAGNOSIS — I69398 Other sequelae of cerebral infarction: Secondary | ICD-10-CM

## 2024-04-30 DIAGNOSIS — F688 Other specified disorders of adult personality and behavior: Secondary | ICD-10-CM | POA: Insufficient documentation

## 2024-04-30 DIAGNOSIS — Z23 Encounter for immunization: Secondary | ICD-10-CM | POA: Insufficient documentation

## 2024-04-30 DIAGNOSIS — I69354 Hemiplegia and hemiparesis following cerebral infarction affecting left non-dominant side: Secondary | ICD-10-CM

## 2024-04-30 NOTE — Telephone Encounter (Signed)
 Patient was seen for provider this morning.  Copied from CRM [id number]. Topic: Clinical - Medical Advice >> Apr 26, 2024 10:03 AM Donna BRAVO wrote: Reason for CRM:  Rosaline would like to speak with Dr Sherre or nurse regarding the appt today and memory issues.   Rosaline (Niece/Nephew) POA  [phone]

## 2024-04-30 NOTE — Telephone Encounter (Signed)
 FYI Only or Action Required?: Action required by provider: clinical question for provider and update on patient condition.  Patient was last seen in primary care on 04/26/2024 by Sherre Clapper, MD.  Called Nurse Triage reporting Advice Only.  Symptoms began several years ago.  Interventions attempted: marijuana.  Symptoms are: unchanged.  Triage Disposition: Call PCP Within 24 Hours  Patient/caregiver understands and will follow disposition?: Yes     Message from Michelle Montes sent at 04/30/2024  7:55 AM EST  Summary: Altered Mental Status   Reason for Triage: Altered mental status.         Reason for Disposition  [1] Follow-up call from patient regarding patient's clinical status AND [2] information NON-URGENT  Answer Assessment - Initial Assessment Questions 1. REASON FOR CALL or QUESTION: What is your reason for calling today? or How can I best     Sister Michelle Montes on the phone and wants to ask where do we start to get the patient help. She states they want to call the magistrate to come get her. Same symptoms as office visit on 04/26/24 (confrontational, nervous, not like herself). no suicidal or homicidal ideation/thoughts/plans. She would like to add the onset of symptoms was 2-3 years ago. Wants to follow up with Dr Sherre, said she was going to reach out to patient's neurosurgeon.  2. CALLER: Document the source of call. (e.g., laboratory staff, caregiver or patient).     Michelle Montes, sister.  Protocols used: PCP Call - No Triage-A-AH

## 2024-04-30 NOTE — Assessment & Plan Note (Addendum)
 Concerned about possibly another stroke or other etiology in brain (mass?) Recommend MRI/MRA of brain without contrast.  Refer back to neurosurgery

## 2024-04-30 NOTE — Assessment & Plan Note (Addendum)
 MRA of brain ordered.  Referred back to neurosurgery.

## 2024-04-30 NOTE — Assessment & Plan Note (Addendum)
 Refuses medicine. Defers counseling for now.  Work up for brain etiology.

## 2024-04-30 NOTE — Assessment & Plan Note (Addendum)
 Ordered Orders:   MM 3D SCREENING MAMMOGRAM BILATERAL BREAST; Future

## 2024-04-30 NOTE — Assessment & Plan Note (Addendum)
  Orders:   Pfizer Comirnaty Covid-19 Vaccine 81yrs & older

## 2024-04-30 NOTE — Patient Instructions (Signed)
 ORDERING AN MRI/MRA OF BRAIN WITHOUT CONTRAST.  REFERRING BACK TO NEUROSURGERY (DR. [email or web address]) ENCOURAGED PATIENT TO START COUNSELING TO HELP COPE.  CONSIDER MEDICATION FOR MOOD IN THE FUTURE.  CHECK LABS.  RETURN IN 1 WEEK FOR RECHECK ON LABS AND BP CHECK, BRING HOME BP CUFF PLEASE BRING LEGAL DOCUMENTS FOR ADVANCE DIRECTIVES.

## 2024-05-01 ENCOUNTER — Telehealth: Payer: Self-pay | Admitting: Family Medicine

## 2024-05-01 NOTE — Telephone Encounter (Signed)
 Left message for patient to call our office or to come by. Explained we needed a copy of her POA forms and for her to complete a DPR form. Also attempted to contact Niece was unable to leave message- line disconnected.

## 2024-05-01 NOTE — Telephone Encounter (Signed)
 Patient called the office stating that she was returning a call from Dr.Cox or Dr.Cox's nurse. Patient was getting transferred to the office and hung up. Stated to the E2C2 agent that I would send a message and have a nurse give her a call back.

## 2024-05-01 NOTE — Telephone Encounter (Signed)
 Patient aware and states she will have her niece bring a copy of the POA forms and she will come by to fill out the [facility] form.

## 2024-05-02 ENCOUNTER — Encounter: Payer: Self-pay | Admitting: Family Medicine

## 2024-05-02 ENCOUNTER — Ambulatory Visit (INDEPENDENT_AMBULATORY_CARE_PROVIDER_SITE_OTHER): Payer: Medicare (Managed Care) | Admitting: Family Medicine

## 2024-05-02 VITALS — BP 160/70 | HR 100 | Temp 98.0°F | Resp 18 | Ht 65.0 in | Wt 120.6 lb

## 2024-05-02 DIAGNOSIS — Z658 Other specified problems related to psychosocial circumstances: Secondary | ICD-10-CM

## 2024-05-02 DIAGNOSIS — I10 Essential (primary) hypertension: Secondary | ICD-10-CM

## 2024-05-02 DIAGNOSIS — S41131D Puncture wound without foreign body of right upper arm, subsequent encounter: Secondary | ICD-10-CM

## 2024-05-02 DIAGNOSIS — W540XXD Bitten by dog, subsequent encounter: Secondary | ICD-10-CM

## 2024-05-02 NOTE — Progress Notes (Signed)
 "  Subjective:  Patient ID: Michelle Montes, female    DOB: May 04, 1960  Age: 64 y.o. MRN: [id number]  Chief Complaint  Patient presents with   Hospitalization Follow-up    Discussed the use of AI scribe software for clinical note transcription with the patient, who gave verbal consent to proceed.  History of Present Illness   Michelle Montes is a 64 year old female who presents with a dog bite wound for wound care and follow-up.  Dog bite wound - Sustained dog bite from own pit bull on Sunday after stepping on dog's foot - Wounds required staples - No pain at wound site - Concerned about proper cleaning and care - No fever, chills, or purulent drainage from wound - Wound with staples left to drain slightly; some blood noted during cleaning - No imaging performed yet - Follow-up appointment scheduled with orthopedic surgeon  Antibiotic and analgesic use - Currently taking Augmentin  for infection prophylaxis - Took Aleve for pain and swelling  Blood pressure elevation - Elevated blood pressure recorded at 160/84 during previous visit and 159/70 at hospital - Takes antihypertensive medication once daily at 4 AM - Monitors blood pressure regularly - Believes Aleve may have contributed to elevated blood pressure - Follow-up appointment scheduled for blood pressure management  Psychological stress and anxiety - Experiences stress and anxiety attributed to disorder in her life - Considering counseling but has not committed  Associated symptoms - No recent falls or unilateral weakness - Vomited once due to heat exposure              12/01/2023    8:16 AM 06/07/2023    8:42 AM 04/26/2023    7:37 AM 10/20/2022    9:05 AM 04/07/2022    7:37 AM  Depression screen PHQ 2/9  Decreased Interest 1 0 0 0 0  Down, Depressed, Hopeless 1 0 0 0 0  PHQ - 2 Score 2 0 0 0 0  Altered sleeping 0 0 0 2   Tired, decreased energy 0 0 0 0   Change in appetite 0 0 0 0   Feeling bad or failure  about yourself  0 0 0 0   Trouble concentrating 0 0 0 0   Moving slowly or fidgety/restless 0 0 0 0   Suicidal thoughts 0 0 0 0   PHQ-9 Score 2  0  0  2    Difficult doing work/chores Not difficult at all Not difficult at all Not difficult at all Not difficult at all      Data saved with a previous flowsheet row definition        12/01/2023    8:16 AM  Fall Risk   Falls in the past year? 0  Number falls in past yr: 0  Injury with Fall? 0   Risk for fall due to : No Fall Risks  Follow up Falls evaluation completed     Data saved with a previous flowsheet row definition    Patient Care Team: Sherre Clapper, MD as PCP - General (Family Medicine)   Review of Systems  Constitutional:  Negative for chills, diaphoresis, fatigue and fever.  HENT:  Negative for congestion, ear pain and sinus pain.   Eyes: Negative.   Respiratory:  Negative for cough and shortness of breath.   Cardiovascular:  Negative for chest pain.  Gastrointestinal:  Positive for vomiting. Negative for abdominal pain, constipation, diarrhea and nausea.  Endocrine: Negative.   Genitourinary:  Negative for  dysuria, frequency and urgency.  Musculoskeletal:  Negative for arthralgias.  Skin:  Positive for wound.       Dog bite  Allergic/Immunologic: Negative.   Neurological:  Negative for dizziness, weakness, light-headedness and headaches.  Psychiatric/Behavioral:  Negative for dysphoric mood. The patient is not nervous/anxious.     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   Past Surgical History:  Procedure Laterality Date   CHOLECYSTECTOMY     2004   IR ANGIO  INTRA EXTRACRAN SEL INTERNAL CAROTID BILAT MOD SED  10/14/2017   IR ANGIO INTRA EXTRACRAN SEL INTERNAL CAROTID BILAT MOD SED  03/06/2019   IR ANGIO INTRA EXTRACRAN SEL INTERNAL CAROTID UNI L MOD SED  12/09/2017   IR ANGIO INTRA EXTRACRAN SEL INTERNAL CAROTID UNI L MOD SED  06/02/2018   IR ANGIO VERTEBRAL SEL VERTEBRAL BILAT MOD SED  10/14/2017   IR ANGIO VERTEBRAL SEL VERTEBRAL UNI R MOD SED  03/06/2019   IR ANGIOGRAM FOLLOW UP STUDY  12/09/2017   IR TRANSCATH/EMBOLIZ  12/09/2017   IR US  GUIDE VASC ACCESS RIGHT  03/06/2019   RADIOLOGY WITH ANESTHESIA N/A 12/09/2017   Procedure: Embolization of aneurysm;  Surgeon: Lanis Pupa, MD;  Location: [facility] OR;  Service: Radiology;  Laterality: N/A;   TUBAL LIGATION     2005   WRIST SURGERY Left    2017- screws and plates    Family History  Problem Relation Age of Onset   Hypertension Mother    Heart attack Mother    Hypertension Father    Heart attack Father    Heart attack Brother    Hypertension Brother    Hyperlipidemia Brother    Heart failure Brother    Breast cancer Neg Hx    Social History   Socioeconomic History   Marital status: Significant Other    Spouse name: Not on file   Number of children: 0   Years of education: Not on file   Highest education level: Not on file  Occupational History   Not on file  Tobacco Use   Smoking status: Every Day    Current packs/day: 1.50    Average packs/day: 1.5 packs/day for 42.0 years (63.0 ttl pk-yrs)    Types: Cigarettes   Smokeless tobacco: Never  Vaping Use   Vaping status: Never Used  Substance and Sexual Activity   Alcohol use: Yes    Alcohol/week: 2.0 standard drinks of alcohol    Types: 2 Cans of beer per week    Comment: socially   Drug use: Never   Sexual activity: Yes    Partners: Male  Other Topics Concern   Not on file  Social History Narrative   Not on file   Social Drivers of Health   Tobacco Use: High Risk (05/02/2024)   Patient History    Smoking Tobacco  Use: Every Day    Smokeless Tobacco Use: Never    Passive Exposure: Not on file  Financial Resource Strain: Low Risk (12/01/2023)   Overall Financial Resource Strain (CARDIA)    Difficulty of Paying Living Expenses: Not hard at all  Food Insecurity: No Food Insecurity (04/26/2024)  Epic    Worried About Programme Researcher, Broadcasting/film/video in the Last Year: Never true    [employer] of Food in the Last Year: Never true  Transportation Needs: No Transportation Needs (04/26/2024)   Epic    Lack of Transportation (Medical): No    Lack of Transportation (Non-Medical): No  Physical Activity: Insufficiently Active (12/01/2023)   Exercise Vital Sign    Days of Exercise per Week: 2 days    Minutes of Exercise per Session: 60 min  Stress: No Stress Concern Present (12/01/2023)   [name] of Occupational Health - Occupational Stress Questionnaire    Feeling of Stress: Not at all  Social Connections: Socially Isolated (12/01/2023)   Social Connection and Isolation Panel    Frequency of Communication with Friends and Family: More than three times a week    Frequency of Social Gatherings with Friends and Family: More than three times a week    Attends Religious Services: Never    Database Administrator or Organizations: No    Attends Banker Meetings: Never    Marital Status: Divorced  Depression (PHQ2-9): Low Risk (12/01/2023)   Depression (PHQ2-9)    PHQ-2 Score: 2  Alcohol Screen: Low Risk (12/01/2023)   Alcohol Screen    Last Alcohol Screening Score (AUDIT): 1  Housing: Low Risk (04/26/2024)   Epic    Unable to Pay for Housing in the Last Year: No    Number of Times Moved in the Last Year: 0    Homeless in the Last Year: No  Utilities: Not At Risk (04/26/2024)   Epic    Threatened with loss of utilities: No  Health Literacy: Adequate Health Literacy (12/01/2023)   B1300 Health Literacy    Frequency of need for help with medical instructions: Never    Objective:  BP (!) 160/70   Pulse  100   Temp 98 F (36.7 C) (Temporal)   Resp 18   Ht 5' 5 (1.651 m)   Wt 120 lb 9.6 oz (54.7 kg)   SpO2 99%   BMI 20.07 kg/m      05/02/2024    8:38 AM 04/26/2024    8:44 AM 04/26/2024    7:43 AM  BP/Weight  Systolic BP 160 160 136  Diastolic BP 70 84 80  Wt. (Lbs) 120.6  116  BMI 20.07 kg/m2  19.3 kg/m2    Physical Exam Vitals reviewed.  Constitutional:      General: She is not in acute distress.    Appearance: Normal appearance. She is normal weight. She is not ill-appearing.  Eyes:     Conjunctiva/sclera: Conjunctivae normal.  Cardiovascular:     Rate and Rhythm: Normal rate and regular rhythm.     Heart sounds: Normal heart sounds. No murmur heard. Pulmonary:     Effort: Pulmonary effort is normal. No respiratory distress.     Breath sounds: Normal breath sounds.  Abdominal:     Palpations: Abdomen is soft.  Skin:    Findings: Wound present.     Comments: Dog bite  Neurological:     Mental Status: She is alert. Mental status is at baseline.  Psychiatric:        Mood and Affect: Mood normal.        Behavior: Behavior normal.        Lab Results  Component Value Date   WBC 5.9 04/26/2024   HGB 14.7 04/26/2024   HCT 44.8 04/26/2024   PLT 242 04/26/2024  GLUCOSE 115 (H) 04/26/2024   CHOL 156 04/26/2023   TRIG 109 04/26/2023   HDL 66 04/26/2023   LDLCALC 71 04/26/2023   ALT 27 04/26/2024   AST 20 04/26/2024   NA 144 04/26/2024   K 4.7 04/26/2024   CL 102 04/26/2024   CREATININE 0.68 04/26/2024   BUN 9 04/26/2024   CO2 26 04/26/2024   TSH 1.080 04/26/2024   INR 0.9 03/06/2019    Results for orders placed or performed in visit on 04/26/24  POCT Lipid Panel   Collection Time: 04/26/24  8:02 AM  Result Value Ref Range   TC 142    HDL 27    TRG 79    LDL 100    Non-HDL 116    TC/HDL    CBC with Differential/Platelet   Collection Time: 04/26/24  9:08 AM  Result Value Ref Range   WBC 5.9 3.4 - 10.8 x10E3/uL   RBC 5.11 3.77 - 5.28 x10E6/uL    Hemoglobin 14.7 11.1 - 15.9 g/dL   Hematocrit 55.1 65.9 - 46.6 %   MCV 88 79 - 97 fL   MCH 28.8 26.6 - 33.0 pg   MCHC 32.8 31.5 - 35.7 g/dL   RDW 86.7 88.2 - 84.5 %   Platelets 242 150 - 450 x10E3/uL   Neutrophils 58 Not Estab. %   Lymphs 33 Not Estab. %   Monocytes 7 Not Estab. %   Eos 1 Not Estab. %   Basos 1 Not Estab. %   Neutrophils Absolute 3.5 1.4 - 7.0 x10E3/uL   Lymphocytes Absolute 1.9 0.7 - 3.1 x10E3/uL   Monocytes Absolute 0.4 0.1 - 0.9 x10E3/uL   EOS (ABSOLUTE) 0.1 0.0 - 0.4 x10E3/uL   Basophils Absolute 0.0 0.0 - 0.2 x10E3/uL   Immature Granulocytes 0 Not Estab. %   Immature Grans (Abs) 0.0 0.0 - 0.1 x10E3/uL  Comprehensive metabolic panel with GFR   Collection Time: 04/26/24  9:08 AM  Result Value Ref Range   Glucose 115 (H) 70 - 99 mg/dL   BUN 9 8 - 27 mg/dL   Creatinine, Ser 9.31 0.57 - 1.00 mg/dL   eGFR 97 >40 fO/fpw/8.26   BUN/Creatinine Ratio 13 12 - 28   Sodium 144 134 - 144 mmol/L   Potassium 4.7 3.5 - 5.2 mmol/L   Chloride 102 96 - 106 mmol/L   CO2 26 20 - 29 mmol/L   Calcium  10.1 8.7 - 10.3 mg/dL   Total Protein 7.0 6.0 - 8.5 g/dL   Albumin 4.8 3.9 - 4.9 g/dL   Globulin, Total 2.2 1.5 - 4.5 g/dL   Bilirubin Total 0.6 0.0 - 1.2 mg/dL   Alkaline Phosphatase 121 49 - 135 IU/L   AST 20 0 - 40 IU/L   ALT 27 0 - 32 IU/L  TSH   Collection Time: 04/26/24  9:09 AM  Result Value Ref Range   TSH 1.080 0.450 - 4.500 uIU/mL  .  Assessment & Plan:   Assessment & Plan Puncture wound of multiple sites of right upper extremity, subsequent encounter Dog bite wound of right arm Staples placed, no infection signs, on Augmentin  for prophylaxis. - Wound cleaning and dressing change by nurse. - Avoid hydrogen peroxide on wound. - Continue Augmentin . - Consider probiotics to prevent antibiotic-related stomach upset. - Orthopedic follow-up on Monday.    Essential (primary) hypertension Hypertension Blood pressure elevated despite medication, possibly due  to diet and medication interactions. BP Readings from Last 3 Encounters:  05/02/24 ROLLEN)  160/70  04/26/24 (!) 160/84  02/23/24 122/82  - Continue current antihypertensive regimen. - Avoid high-sodium foods and decongestants. - Bring blood pressure cuff for calibration at next appointment. - Follow up with primary care for management.    Psychosocial distress Depression and anxiety Stress and anxiety linked to personal stressors, considering counseling. - Consider and select a counselor. - Utilize partner Greg's support.     Dog bite, subsequent encounter Dog bite wound of right arm Staples placed, no infection signs, on Augmentin  for prophylaxis. - Wound cleaning and dressing change by nurse. - Avoid hydrogen peroxide on wound. - Continue Augmentin . - Consider probiotics to prevent antibiotic-related stomach upset. - Orthopedic follow-up on Monday.      Follow-up: Return in about 8 days (around 05/10/2024) for BP recheck, lab visit - keep appt with Dr. Sherre.  An After Visit Summary was printed and given to the patient.  Harrie Cedar, FNP [facility] [phone]      [1]  Current Outpatient Medications on File Prior to Visit  Medication Sig Dispense Refill   albuterol  (VENTOLIN  HFA) 108 (90 Base) MCG/ACT inhaler Inhale 2 puffs into the lungs every 6 (six) hours as needed for wheezing or shortness of breath. 8 g 2   amoxicillin -clavulanate (AUGMENTIN ) 875-125 MG tablet Take 1 tablet by mouth 2 (two) times daily.     aspirin  EC 81 MG tablet Take 81 mg by mouth daily. Swallow whole.     Azelastine  HCl 137 MCG/SPRAY SOLN PLACE 2 SPRAYS INTO BOTH NOSTRILS 2 (TWO) TIMES DAILY. USE IN EACH NOSTRIL AS DIRECTED 90 mL 3   ezetimibe  (ZETIA ) 10 MG tablet Take 1 tablet (10 mg total) by mouth daily. 90 tablet 3   omeprazole  (PRILOSEC  OTC) 20 MG tablet Take 1 tablet (20 mg total) by mouth daily as needed (heartburn/indigestion). 90 tablet 2   oxyCODONE  (OXY IR/ROXICODONE ) 5 MG  immediate release tablet Take 2.5-5 mg by mouth every 4 (four) hours as needed.     valsartan -hydrochlorothiazide  (DIOVAN -HCT) 160-12.5 MG tablet Take 1 tablet by mouth daily. 90 tablet 0   atorvastatin  (LIPITOR ) 80 MG tablet TAKE 1 TABLET(80 MG) BY MOUTH EVERY DAY (Patient not taking: Reported on 05/02/2024) 90 tablet 0   No current facility-administered medications on file prior to visit.   "

## 2024-05-02 NOTE — Assessment & Plan Note (Signed)
 Depression and anxiety Stress and anxiety linked to personal stressors, considering counseling. - Consider and select a counselor. - Utilize partner Greg's support.

## 2024-05-02 NOTE — Assessment & Plan Note (Signed)
 Hypertension Blood pressure elevated despite medication, possibly due to diet and medication interactions. BP Readings from Last 3 Encounters:  05/02/24 (!) 160/70  04/26/24 (!) 160/84  02/23/24 122/82  - Continue current antihypertensive regimen. - Avoid high-sodium foods and decongestants. - Bring blood pressure cuff for calibration at next appointment. - Follow up with primary care for management.

## 2024-05-03 ENCOUNTER — Telehealth: Payer: Self-pay

## 2024-05-03 DIAGNOSIS — W540XXA Bitten by dog, initial encounter: Secondary | ICD-10-CM | POA: Insufficient documentation

## 2024-05-03 DIAGNOSIS — S41131A Puncture wound without foreign body of right upper arm, initial encounter: Secondary | ICD-10-CM | POA: Insufficient documentation

## 2024-05-03 NOTE — Assessment & Plan Note (Addendum)
 Dog bite wound of right arm Staples placed, no infection signs, on Augmentin  for prophylaxis. - Wound cleaning and dressing change by nurse. - Avoid hydrogen peroxide on wound. - Continue Augmentin . - Consider probiotics to prevent antibiotic-related stomach upset. - Orthopedic follow-up on Monday.

## 2024-05-03 NOTE — Assessment & Plan Note (Signed)
 Dog bite wound of right arm Staples placed, no infection signs, on Augmentin  for prophylaxis. - Wound cleaning and dressing change by nurse. - Avoid hydrogen peroxide on wound. - Continue Augmentin . - Consider probiotics to prevent antibiotic-related stomach upset. - Orthopedic follow-up on Monday.

## 2024-05-03 NOTE — Telephone Encounter (Signed)
 Called becky back left VM that patient scan are scheduled for 05/16/24, and call with anymore question  Copied from CRM #[id number]. Topic: Referral - Question >> May 03, 2024  1:25 PM Hadassah PARAS wrote: Reason for CRM: Rhoda from [place] called in stating that referral shows that MRI and MRA needed to be scheduled. She cannot schedule pt with her office until those other two app have been scheduled. Please call Rhoda once these are scheduled to #[phone] ext [phone]

## 2024-05-08 ENCOUNTER — Encounter: Payer: Self-pay | Admitting: Family Medicine

## 2024-05-10 ENCOUNTER — Ambulatory Visit: Payer: Medicare (Managed Care) | Admitting: Family Medicine

## 2024-05-10 ENCOUNTER — Encounter: Payer: Self-pay | Admitting: Family Medicine

## 2024-05-10 VITALS — BP 158/84 | HR 90 | Temp 98.0°F | Resp 18 | Ht 65.0 in | Wt 123.7 lb

## 2024-05-10 DIAGNOSIS — I10 Essential (primary) hypertension: Secondary | ICD-10-CM

## 2024-05-10 DIAGNOSIS — R739 Hyperglycemia, unspecified: Secondary | ICD-10-CM

## 2024-05-10 DIAGNOSIS — S41131D Puncture wound without foreign body of right upper arm, subsequent encounter: Secondary | ICD-10-CM

## 2024-05-10 LAB — POCT GLYCOSYLATED HEMOGLOBIN (HGB A1C): Hemoglobin A1C: 5.9 % — AB (ref 4.0–5.6)

## 2024-05-10 NOTE — Assessment & Plan Note (Addendum)
 Elevated blood pressure Variable blood pressure possibly due to pain or stress. Management deferred to Dr. Sherre. - Rechecked blood pressure before she left. - Discuss management with Dr. Sherre at upcoming appointment.

## 2024-05-10 NOTE — Assessment & Plan Note (Signed)
 Wound healing well, no infection signs. Discomfort from tight wrap resolved after removal. - Continue air exposure to wound. - Use betadine solution as needed.

## 2024-05-10 NOTE — Progress Notes (Signed)
 "  Subjective:  Patient ID: Michelle Montes, female    DOB: August 08, 1960  Age: 64 y.o. MRN: [id number]  Chief Complaint  Patient presents with   Hypertension   Animal Bite    Discussed the use of AI scribe software for clinical note transcription with the patient, who gave verbal consent to proceed.  History of Present Illness   Michelle Montes is a 64 year old female who presents for follow-up on elevated blood pressure, elevated glucose levels and wound care.  Hyperglycemia - Concern regarding elevated glucose levels. - Previous glucose reading was 115 mg/dL. - A1c test was not completed as planned. - Consumed yogurt and Dr. Nunzio this morning. - Attributes previous high glucose readings to recent intake of candies and Dr. Nunzio. - Doubts diagnosis of prediabetes.  Wound healing status - Arm wound healing well following recent procedure. - Staples removed yesterday. - No fever, oozing, or red streaks present. - Taking prescribed antibiotics. - Removed wrapping due to concern about tightness. - Allowing air exposure to wound since yesterday.  Blood pressure fluctuations - Blood pressure fluctuating at home. - Possible association with discomfort and stress related to dog's health issues.  Pain management - Using Aleve for discomfort. - Not using oxycodone .  Psychological distress - Distressed about dog's ear infection and need for sedation during veterinary visits. - Finds managing pet's health issues challenging.          12/01/2023    8:16 AM 06/07/2023    8:42 AM 04/26/2023    7:37 AM 10/20/2022    9:05 AM 04/07/2022    7:37 AM  Depression screen PHQ 2/9  Decreased Interest 1 0 0 0 0  Down, Depressed, Hopeless 1 0 0 0 0  PHQ - 2 Score 2 0 0 0 0  Altered sleeping 0 0 0 2   Tired, decreased energy 0 0 0 0   Change in appetite 0 0 0 0   Feeling bad or failure about yourself  0 0 0 0   Trouble concentrating 0 0 0 0   Moving slowly or fidgety/restless 0 0 0 0    Suicidal thoughts 0 0 0 0   PHQ-9 Score 2  0  0  2    Difficult doing work/chores Not difficult at all Not difficult at all Not difficult at all Not difficult at all      Data saved with a previous flowsheet row definition        12/01/2023    8:16 AM  Fall Risk   Falls in the past year? 0  Number falls in past yr: 0  Injury with Fall? 0   Risk for fall due to : No Fall Risks  Follow up Falls evaluation completed     Data saved with a previous flowsheet row definition    Patient Care Team: Sherre Clapper, MD as PCP - General (Family Medicine)   Review of Systems  Constitutional:  Negative for chills, diaphoresis, fatigue and fever.  HENT:  Negative for congestion, ear pain and sinus pain.   Eyes: Negative.   Respiratory:  Negative for cough and shortness of breath.   Cardiovascular:  Negative for chest pain.  Gastrointestinal:  Negative for abdominal pain, constipation, nausea and vomiting.  Endocrine: Negative.   Genitourinary:  Negative for dysuria, frequency and urgency.  Musculoskeletal:  Negative for arthralgias.  Allergic/Immunologic: Negative.   Neurological:  Negative for dizziness, weakness, light-headedness and headaches.  Hematological: Negative.   Psychiatric/Behavioral:  Negative.  Negative for dysphoric mood. The patient is not nervous/anxious.     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   Past Surgical History:  Procedure Laterality Date   CHOLECYSTECTOMY     2004   IR ANGIO INTRA EXTRACRAN SEL INTERNAL CAROTID BILAT MOD SED  10/14/2017   IR ANGIO INTRA EXTRACRAN SEL INTERNAL CAROTID BILAT MOD SED  03/06/2019    IR ANGIO INTRA EXTRACRAN SEL INTERNAL CAROTID UNI L MOD SED  12/09/2017   IR ANGIO INTRA EXTRACRAN SEL INTERNAL CAROTID UNI L MOD SED  06/02/2018   IR ANGIO VERTEBRAL SEL VERTEBRAL BILAT MOD SED  10/14/2017   IR ANGIO VERTEBRAL SEL VERTEBRAL UNI R MOD SED  03/06/2019   IR ANGIOGRAM FOLLOW UP STUDY  12/09/2017   IR TRANSCATH/EMBOLIZ  12/09/2017   IR US  GUIDE VASC ACCESS RIGHT  03/06/2019   RADIOLOGY WITH ANESTHESIA N/A 12/09/2017   Procedure: Embolization of aneurysm;  Surgeon: Lanis Pupa, MD;  Location: [facility] OR;  Service: Radiology;  Laterality: N/A;   TUBAL LIGATION     2005   WRIST SURGERY Left    2017- screws and plates    Family History  Problem Relation Age of Onset   Hypertension Mother    Heart attack Mother    Hypertension Father    Heart attack Father    Heart attack Brother    Hypertension Brother    Hyperlipidemia Brother    Heart failure Brother    Breast cancer Neg Hx    Social History   Socioeconomic History   Marital status: Significant Other    Spouse name: Not on file   Number of children: 0   Years of education: Not on file   Highest education level: Not on file  Occupational History   Not on file  Tobacco Use   Smoking status: Every Day    Current packs/day: 1.50    Average packs/day: 1.5 packs/day for 42.0 years (63.0 ttl pk-yrs)    Types: Cigarettes   Smokeless tobacco: Never  Vaping Use   Vaping status: Never Used  Substance and Sexual Activity   Alcohol use: Yes    Alcohol/week: 2.0 standard drinks of alcohol    Types: 2 Cans of beer per week    Comment: socially   Drug use: Never   Sexual activity: Yes    Partners: Male  Other Topics Concern   Not on file  Social History Narrative   Not on file   Social Drivers of Health   Tobacco Use: High Risk (05/10/2024)   Patient History    Smoking Tobacco Use: Every Day    Smokeless Tobacco Use: Never    Passive Exposure: Not on file  Financial Resource Strain: Low Risk (12/01/2023)    Overall Financial Resource Strain (CARDIA)    Difficulty of Paying Living Expenses: Not hard at all  Food Insecurity: No Food Insecurity (04/26/2024)   Epic    Worried About Radiation Protection Practitioner of Food in the Last Year: Never true    Ran Out of Food in the Last Year: Never true  Transportation Needs: No Transportation Needs (04/26/2024)  Epic    Lack of Transportation (Medical): No    Lack of Transportation (Non-Medical): No  Physical Activity: Insufficiently Active (12/01/2023)   Exercise Vital Sign    Days of Exercise per Week: 2 days    Minutes of Exercise per Session: 60 min  Stress: No Stress Concern Present (12/01/2023)   [name] of Occupational Health - Occupational Stress Questionnaire    Feeling of Stress: Not at all  Social Connections: Socially Isolated (12/01/2023)   Social Connection and Isolation Panel    Frequency of Communication with Friends and Family: More than three times a week    Frequency of Social Gatherings with Friends and Family: More than three times a week    Attends Religious Services: Never    Database Administrator or Organizations: No    Attends Banker Meetings: Never    Marital Status: Divorced  Depression (PHQ2-9): Low Risk (12/01/2023)   Depression (PHQ2-9)    PHQ-2 Score: 2  Alcohol Screen: Low Risk (12/01/2023)   Alcohol Screen    Last Alcohol Screening Score (AUDIT): 1  Housing: Low Risk (04/26/2024)   Epic    Unable to Pay for Housing in the Last Year: No    Number of Times Moved in the Last Year: 0    Homeless in the Last Year: No  Utilities: Not At Risk (04/26/2024)   Epic    Threatened with loss of utilities: No  Health Literacy: Adequate Health Literacy (12/01/2023)   B1300 Health Literacy    Frequency of need for help with medical instructions: Never    Objective:  BP (!) 158/84   Pulse 90   Temp 98 F (36.7 C) (Temporal)   Resp 18   Ht 5' 5 (1.651 m)   Wt 123 lb 11.2 oz (56.1 kg)   SpO2 99%   BMI 20.58 kg/m       05/10/2024    9:16 AM 05/02/2024    8:38 AM 04/26/2024    8:44 AM  BP/Weight  Systolic BP 158 160 160  Diastolic BP 84 70 84  Wt. (Lbs) 123.7 120.6   BMI 20.58 kg/m2 20.07 kg/m2     Physical Exam Vitals reviewed.  Constitutional:      General: She is not in acute distress.    Appearance: Normal appearance.  HENT:     Nose: No congestion.  Eyes:     Conjunctiva/sclera: Conjunctivae normal.  Cardiovascular:     Rate and Rhythm: Normal rate and regular rhythm.     Heart sounds: Normal heart sounds. No murmur heard. Pulmonary:     Effort: Pulmonary effort is normal. No respiratory distress.     Breath sounds: Normal breath sounds. No wheezing.  Skin:    Findings: Wound present.     Comments: Bog bite healing - recently wrapped after staple removal yesterday  Neurological:     Mental Status: She is alert and oriented to person, place, and time.  Psychiatric:        Mood and Affect: Mood normal.        Behavior: Behavior normal.     Lab Results  Component Value Date   WBC 5.9 04/26/2024   HGB 14.7 04/26/2024   HCT 44.8 04/26/2024   PLT 242 04/26/2024   GLUCOSE 115 (H) 04/26/2024   CHOL 156 04/26/2023   TRIG 109 04/26/2023   HDL 66 04/26/2023   LDLCALC 71 04/26/2023   ALT 27 04/26/2024   AST 20 04/26/2024  NA 144 04/26/2024   K 4.7 04/26/2024   CL 102 04/26/2024   CREATININE 0.68 04/26/2024   BUN 9 04/26/2024   CO2 26 04/26/2024   TSH 1.080 04/26/2024   INR 0.9 03/06/2019   HGBA1C 5.9 (A) 05/10/2024    Results for orders placed or performed in visit on 05/10/24  POCT glycosylated hemoglobin (Hb A1C)   Collection Time: 05/10/24 10:10 AM  Result Value Ref Range   Hemoglobin A1C 5.9 (A) 4.0 - 5.6 %   HbA1c POC (<> result, manual entry)     HbA1c, POC (prediabetic range)     HbA1c, POC (controlled diabetic range)    .  Assessment & Plan:   Assessment & Plan Elevated serum glucose Hyperglycemia Glucose elevated at 115 mg/dL. A1c not obtained;  point-of-care A1c test planned but not performed today. - Discuss results before she leaves. Orders:   POCT glycosylated hemoglobin (Hb A1C)  Essential (primary) hypertension Elevated blood pressure Variable blood pressure possibly due to pain or stress. Management deferred to Dr. Sherre. - Rechecked blood pressure before she left. - Discuss management with Dr. Sherre at upcoming appointment.     Puncture wound of multiple sites of right upper extremity, subsequent encounter Wound healing well, no infection signs. Discomfort from tight wrap resolved after removal. - Continue air exposure to wound. - Use betadine solution as needed.      Follow-up: Return in about 7 weeks (around 06/28/2024) for keep appt w dr. cox.  An After Visit Summary was printed and given to the patient.  Harrie Cedar, FNP [facility] [phone]      [1]  Current Outpatient Medications on File Prior to Visit  Medication Sig Dispense Refill   aspirin  EC 81 MG tablet Take 81 mg by mouth daily. Swallow whole.     atorvastatin  (LIPITOR ) 80 MG tablet TAKE 1 TABLET(80 MG) BY MOUTH EVERY DAY 90 tablet 0   Azelastine  HCl 137 MCG/SPRAY SOLN PLACE 2 SPRAYS INTO BOTH NOSTRILS 2 (TWO) TIMES DAILY. USE IN EACH NOSTRIL AS DIRECTED 90 mL 3   ezetimibe  (ZETIA ) 10 MG tablet Take 1 tablet (10 mg total) by mouth daily. 90 tablet 3   omeprazole  (PRILOSEC  OTC) 20 MG tablet Take 1 tablet (20 mg total) by mouth daily as needed (heartburn/indigestion). 90 tablet 2   valsartan -hydrochlorothiazide  (DIOVAN -HCT) 160-12.5 MG tablet Take 1 tablet by mouth daily. 90 tablet 0   oxyCODONE  (OXY IR/ROXICODONE ) 5 MG immediate release tablet Take 2.5-5 mg by mouth every 4 (four) hours as needed. (Patient not taking: Reported on 05/10/2024)     No current facility-administered medications on file prior to visit.   "

## 2024-05-10 NOTE — Assessment & Plan Note (Signed)
 Hyperglycemia Glucose elevated at 115 mg/dL. A1c not obtained; point-of-care A1c test planned but not performed today. - Discuss results before she leaves. Orders:   POCT glycosylated hemoglobin (Hb A1C)

## 2024-05-16 ENCOUNTER — Ambulatory Visit (HOSPITAL_BASED_OUTPATIENT_CLINIC_OR_DEPARTMENT_OTHER)
Admission: RE | Admit: 2024-05-16 | Discharge: 2024-05-16 | Disposition: A | Payer: Medicare (Managed Care) | Source: Ambulatory Visit | Attending: Family Medicine | Admitting: Family Medicine

## 2024-05-16 DIAGNOSIS — I69398 Other sequelae of cerebral infarction: Secondary | ICD-10-CM

## 2024-05-16 DIAGNOSIS — I69354 Hemiplegia and hemiparesis following cerebral infarction affecting left non-dominant side: Secondary | ICD-10-CM

## 2024-05-16 DIAGNOSIS — Z8679 Personal history of other diseases of the circulatory system: Secondary | ICD-10-CM

## 2024-05-17 ENCOUNTER — Ambulatory Visit: Payer: Self-pay | Admitting: Family Medicine

## 2024-06-28 ENCOUNTER — Ambulatory Visit: Payer: Medicare (Managed Care) | Admitting: Family Medicine

## 2024-07-03 ENCOUNTER — Ambulatory Visit (HOSPITAL_BASED_OUTPATIENT_CLINIC_OR_DEPARTMENT_OTHER): Payer: Medicare (Managed Care) | Admitting: Radiology
